# Patient Record
Sex: Female | Born: 1940 | Race: White | Hispanic: No | Marital: Married | Smoking: Never smoker
Health system: Southern US, Community
[De-identification: ages and names within clinical notes are randomized; demographics above are authoritative.]

## PROBLEM LIST (undated history)

## (undated) DIAGNOSIS — C801 Malignant (primary) neoplasm, unspecified: Secondary | ICD-10-CM

## (undated) DIAGNOSIS — M65341 Trigger finger, right ring finger: Secondary | ICD-10-CM

## (undated) DIAGNOSIS — F419 Anxiety disorder, unspecified: Secondary | ICD-10-CM

## (undated) DIAGNOSIS — M1990 Unspecified osteoarthritis, unspecified site: Secondary | ICD-10-CM

## (undated) DIAGNOSIS — N289 Disorder of kidney and ureter, unspecified: Secondary | ICD-10-CM

## (undated) DIAGNOSIS — R296 Repeated falls: Secondary | ICD-10-CM

## (undated) DIAGNOSIS — F0390 Unspecified dementia without behavioral disturbance: Secondary | ICD-10-CM

## (undated) DIAGNOSIS — G43909 Migraine, unspecified, not intractable, without status migrainosus: Secondary | ICD-10-CM

## (undated) DIAGNOSIS — I639 Cerebral infarction, unspecified: Secondary | ICD-10-CM

## (undated) DIAGNOSIS — Z9889 Other specified postprocedural states: Secondary | ICD-10-CM

## (undated) DIAGNOSIS — R55 Syncope and collapse: Secondary | ICD-10-CM

## (undated) DIAGNOSIS — F341 Dysthymic disorder: Secondary | ICD-10-CM

## (undated) DIAGNOSIS — R7302 Impaired glucose tolerance (oral): Secondary | ICD-10-CM

## (undated) DIAGNOSIS — M179 Osteoarthritis of knee, unspecified: Secondary | ICD-10-CM

## (undated) DIAGNOSIS — E785 Hyperlipidemia, unspecified: Secondary | ICD-10-CM

## (undated) DIAGNOSIS — I10 Essential (primary) hypertension: Secondary | ICD-10-CM

## (undated) DIAGNOSIS — R112 Nausea with vomiting, unspecified: Secondary | ICD-10-CM

## (undated) DIAGNOSIS — M1710 Unilateral primary osteoarthritis, unspecified knee: Secondary | ICD-10-CM

## (undated) HISTORY — PX: DILATION AND CURETTAGE OF UTERUS: SHX78

## (undated) HISTORY — DX: Osteoarthritis of knee, unspecified: M17.9

## (undated) HISTORY — DX: Anxiety disorder, unspecified: F41.9

## (undated) HISTORY — DX: Malignant (primary) neoplasm, unspecified: C80.1

## (undated) HISTORY — DX: Unilateral primary osteoarthritis, unspecified knee: M17.10

## (undated) HISTORY — PX: JOINT REPLACEMENT: SHX530

## (undated) HISTORY — DX: Dysthymic disorder: F34.1

## (undated) HISTORY — DX: Migraine, unspecified, not intractable, without status migrainosus: G43.909

## (undated) HISTORY — DX: Impaired glucose tolerance (oral): R73.02

## (undated) HISTORY — PX: REPLACEMENT TOTAL KNEE: SUR1224

## (undated) HISTORY — DX: Disorder of kidney and ureter, unspecified: N28.9

## (undated) HISTORY — DX: Hyperlipidemia, unspecified: E78.5

## (undated) HISTORY — DX: Essential (primary) hypertension: I10

## (undated) HISTORY — DX: Unspecified osteoarthritis, unspecified site: M19.90

## (undated) HISTORY — PX: ELBOW SURGERY: SHX618

---

## 1998-05-12 ENCOUNTER — Other Ambulatory Visit: Admission: RE | Admit: 1998-05-12 | Discharge: 1998-05-12 | Payer: Self-pay | Admitting: Gynecology

## 1999-02-22 HISTORY — PX: BREAST LUMPECTOMY: SHX2

## 1999-09-22 DIAGNOSIS — C801 Malignant (primary) neoplasm, unspecified: Secondary | ICD-10-CM

## 1999-09-22 HISTORY — DX: Malignant (primary) neoplasm, unspecified: C80.1

## 1999-10-07 ENCOUNTER — Other Ambulatory Visit: Admission: RE | Admit: 1999-10-07 | Discharge: 1999-10-07 | Payer: Self-pay | Admitting: Gynecology

## 1999-10-15 ENCOUNTER — Encounter: Admission: RE | Admit: 1999-10-15 | Discharge: 1999-10-15 | Payer: Self-pay | Admitting: General Surgery

## 1999-10-15 ENCOUNTER — Encounter (INDEPENDENT_AMBULATORY_CARE_PROVIDER_SITE_OTHER): Payer: Self-pay | Admitting: *Deleted

## 1999-10-15 ENCOUNTER — Other Ambulatory Visit: Admission: RE | Admit: 1999-10-15 | Discharge: 1999-10-15 | Payer: Self-pay | Admitting: General Surgery

## 1999-10-15 ENCOUNTER — Encounter: Payer: Self-pay | Admitting: General Surgery

## 1999-11-05 ENCOUNTER — Encounter: Payer: Self-pay | Admitting: General Surgery

## 1999-11-05 ENCOUNTER — Encounter: Admission: RE | Admit: 1999-11-05 | Discharge: 1999-11-05 | Payer: Self-pay | Admitting: General Surgery

## 1999-11-08 ENCOUNTER — Ambulatory Visit (HOSPITAL_BASED_OUTPATIENT_CLINIC_OR_DEPARTMENT_OTHER): Admission: RE | Admit: 1999-11-08 | Discharge: 1999-11-08 | Payer: Self-pay | Admitting: General Surgery

## 1999-11-08 ENCOUNTER — Encounter: Payer: Self-pay | Admitting: General Surgery

## 1999-11-08 ENCOUNTER — Encounter (INDEPENDENT_AMBULATORY_CARE_PROVIDER_SITE_OTHER): Payer: Self-pay | Admitting: Specialist

## 1999-11-24 ENCOUNTER — Encounter: Admission: RE | Admit: 1999-11-24 | Discharge: 2000-02-22 | Payer: Self-pay | Admitting: Radiation Oncology

## 2000-05-02 ENCOUNTER — Encounter: Payer: Self-pay | Admitting: General Surgery

## 2000-05-02 ENCOUNTER — Encounter: Admission: RE | Admit: 2000-05-02 | Discharge: 2000-05-02 | Payer: Self-pay | Admitting: General Surgery

## 2000-10-11 ENCOUNTER — Encounter: Admission: RE | Admit: 2000-10-11 | Discharge: 2001-01-09 | Payer: Self-pay | Admitting: Family Medicine

## 2000-10-19 ENCOUNTER — Encounter: Payer: Self-pay | Admitting: General Surgery

## 2000-10-19 ENCOUNTER — Encounter: Admission: RE | Admit: 2000-10-19 | Discharge: 2000-10-19 | Payer: Self-pay | Admitting: General Surgery

## 2000-10-20 ENCOUNTER — Encounter: Payer: Self-pay | Admitting: Orthopaedic Surgery

## 2000-10-24 ENCOUNTER — Inpatient Hospital Stay (HOSPITAL_COMMUNITY): Admission: RE | Admit: 2000-10-24 | Discharge: 2000-10-29 | Payer: Self-pay | Admitting: Orthopaedic Surgery

## 2001-10-23 ENCOUNTER — Encounter: Payer: Self-pay | Admitting: *Deleted

## 2001-10-23 ENCOUNTER — Encounter: Admission: RE | Admit: 2001-10-23 | Discharge: 2001-10-23 | Payer: Self-pay | Admitting: *Deleted

## 2002-01-11 ENCOUNTER — Encounter: Payer: Self-pay | Admitting: Orthopaedic Surgery

## 2002-01-15 ENCOUNTER — Inpatient Hospital Stay (HOSPITAL_COMMUNITY): Admission: RE | Admit: 2002-01-15 | Discharge: 2002-01-19 | Payer: Self-pay | Admitting: Orthopaedic Surgery

## 2002-02-12 ENCOUNTER — Ambulatory Visit (HOSPITAL_BASED_OUTPATIENT_CLINIC_OR_DEPARTMENT_OTHER): Admission: RE | Admit: 2002-02-12 | Discharge: 2002-02-12 | Payer: Self-pay | Admitting: Orthopaedic Surgery

## 2002-11-21 ENCOUNTER — Encounter: Payer: Self-pay | Admitting: Family Medicine

## 2002-11-21 ENCOUNTER — Encounter: Admission: RE | Admit: 2002-11-21 | Discharge: 2002-11-21 | Payer: Self-pay | Admitting: Family Medicine

## 2002-12-24 ENCOUNTER — Other Ambulatory Visit: Admission: RE | Admit: 2002-12-24 | Discharge: 2002-12-24 | Payer: Self-pay | Admitting: Gynecology

## 2003-12-01 ENCOUNTER — Encounter: Admission: RE | Admit: 2003-12-01 | Discharge: 2003-12-01 | Payer: Self-pay | Admitting: Family Medicine

## 2003-12-13 ENCOUNTER — Ambulatory Visit: Payer: Self-pay | Admitting: Oncology

## 2004-02-13 ENCOUNTER — Ambulatory Visit: Payer: Self-pay | Admitting: Oncology

## 2004-02-13 ENCOUNTER — Other Ambulatory Visit: Admission: RE | Admit: 2004-02-13 | Discharge: 2004-02-13 | Payer: Self-pay | Admitting: Gynecology

## 2004-06-24 ENCOUNTER — Ambulatory Visit: Payer: Self-pay | Admitting: Oncology

## 2004-12-01 ENCOUNTER — Encounter: Admission: RE | Admit: 2004-12-01 | Discharge: 2004-12-01 | Payer: Self-pay | Admitting: Oncology

## 2004-12-17 ENCOUNTER — Ambulatory Visit: Payer: Self-pay | Admitting: Oncology

## 2005-06-21 ENCOUNTER — Ambulatory Visit: Payer: Self-pay | Admitting: Oncology

## 2005-06-24 LAB — CBC WITH DIFFERENTIAL/PLATELET
BASO%: 0.5 % (ref 0.0–2.0)
Basophils Absolute: 0 10*3/uL (ref 0.0–0.1)
EOS%: 2.2 % (ref 0.0–7.0)
Eosinophils Absolute: 0.1 10*3/uL (ref 0.0–0.5)
HCT: 36.8 % (ref 34.8–46.6)
HGB: 12.4 g/dL (ref 11.6–15.9)
LYMPH%: 32.2 % (ref 14.0–48.0)
MCH: 30.8 pg (ref 26.0–34.0)
MCHC: 33.6 g/dL (ref 32.0–36.0)
MCV: 91.7 fL (ref 81.0–101.0)
MONO#: 0.3 10*3/uL (ref 0.1–0.9)
MONO%: 5.9 % (ref 0.0–13.0)
NEUT#: 2.9 10*3/uL (ref 1.5–6.5)
NEUT%: 59.2 % (ref 39.6–76.8)
Platelets: 200 10*3/uL (ref 145–400)
RBC: 4.01 10*6/uL (ref 3.70–5.32)
RDW: 13.6 % (ref 11.3–14.5)
WBC: 4.9 10*3/uL (ref 3.9–10.0)
lymph#: 1.6 10*3/uL (ref 0.9–3.3)

## 2005-06-24 LAB — COMPREHENSIVE METABOLIC PANEL
ALT: 18 U/L (ref 0–40)
AST: 26 U/L (ref 0–37)
Albumin: 4 g/dL (ref 3.5–5.2)
Alkaline Phosphatase: 88 U/L (ref 39–117)
BUN: 20 mg/dL (ref 6–23)
CO2: 27 mEq/L (ref 19–32)
Calcium: 9.1 mg/dL (ref 8.4–10.5)
Chloride: 104 mEq/L (ref 96–112)
Creatinine, Ser: 1.1 mg/dL (ref 0.4–1.2)
Glucose, Bld: 90 mg/dL (ref 70–99)
Potassium: 4.5 mEq/L (ref 3.5–5.3)
Sodium: 139 mEq/L (ref 135–145)
Total Bilirubin: 0.6 mg/dL (ref 0.3–1.2)
Total Protein: 6.8 g/dL (ref 6.0–8.3)

## 2005-06-24 LAB — LACTATE DEHYDROGENASE: LDH: 188 U/L (ref 94–250)

## 2005-07-25 ENCOUNTER — Ambulatory Visit: Payer: Self-pay | Admitting: Family Medicine

## 2005-09-28 ENCOUNTER — Ambulatory Visit: Payer: Self-pay | Admitting: Family Medicine

## 2006-03-02 ENCOUNTER — Ambulatory Visit: Payer: Self-pay | Admitting: Family Medicine

## 2006-04-07 ENCOUNTER — Ambulatory Visit: Payer: Self-pay | Admitting: Family Medicine

## 2006-04-11 ENCOUNTER — Ambulatory Visit: Payer: Self-pay | Admitting: Family Medicine

## 2006-05-25 ENCOUNTER — Ambulatory Visit: Payer: Self-pay | Admitting: Family Medicine

## 2006-06-20 ENCOUNTER — Ambulatory Visit: Payer: Self-pay | Admitting: Oncology

## 2006-06-26 ENCOUNTER — Ambulatory Visit: Payer: Self-pay | Admitting: Family Medicine

## 2006-06-27 LAB — CBC WITH DIFFERENTIAL/PLATELET
BASO%: 0.3 % (ref 0.0–2.0)
Basophils Absolute: 0 10*3/uL (ref 0.0–0.1)
EOS%: 3 % (ref 0.0–7.0)
HCT: 37.3 % (ref 34.8–46.6)
HGB: 12.9 g/dL (ref 11.6–15.9)
MCH: 31.4 pg (ref 26.0–34.0)
MCHC: 34.7 g/dL (ref 32.0–36.0)
MONO#: 0.6 10*3/uL (ref 0.1–0.9)
RDW: 13.1 % (ref 11.3–14.5)
WBC: 8.3 10*3/uL (ref 3.9–10.0)
lymph#: 1.9 10*3/uL (ref 0.9–3.3)

## 2006-06-27 LAB — COMPREHENSIVE METABOLIC PANEL
ALT: 14 U/L (ref 0–35)
AST: 19 U/L (ref 0–37)
Albumin: 4.1 g/dL (ref 3.5–5.2)
CO2: 26 mEq/L (ref 19–32)
Calcium: 9 mg/dL (ref 8.4–10.5)
Chloride: 107 mEq/L (ref 96–112)
Potassium: 3.9 mEq/L (ref 3.5–5.3)

## 2006-06-27 LAB — LACTATE DEHYDROGENASE: LDH: 173 U/L (ref 94–250)

## 2006-07-20 ENCOUNTER — Ambulatory Visit: Payer: Self-pay | Admitting: Family Medicine

## 2006-07-21 ENCOUNTER — Encounter: Admission: RE | Admit: 2006-07-21 | Discharge: 2006-07-21 | Payer: Self-pay | Admitting: Family Medicine

## 2006-08-22 ENCOUNTER — Ambulatory Visit: Payer: Self-pay | Admitting: Family Medicine

## 2006-09-22 ENCOUNTER — Ambulatory Visit: Payer: Self-pay | Admitting: Family Medicine

## 2006-10-16 ENCOUNTER — Ambulatory Visit: Payer: Self-pay | Admitting: Family Medicine

## 2006-10-24 ENCOUNTER — Ambulatory Visit: Payer: Self-pay | Admitting: Family Medicine

## 2006-12-14 ENCOUNTER — Ambulatory Visit: Payer: Self-pay | Admitting: Family Medicine

## 2007-01-09 ENCOUNTER — Ambulatory Visit: Payer: Self-pay | Admitting: Family Medicine

## 2007-02-08 ENCOUNTER — Ambulatory Visit: Payer: Self-pay | Admitting: Family Medicine

## 2007-02-12 ENCOUNTER — Ambulatory Visit: Payer: Self-pay | Admitting: Family Medicine

## 2007-06-19 ENCOUNTER — Ambulatory Visit: Payer: Self-pay | Admitting: Oncology

## 2007-06-22 LAB — COMPREHENSIVE METABOLIC PANEL
ALT: 16 U/L (ref 0–35)
AST: 27 U/L (ref 0–37)
Albumin: 4.1 g/dL (ref 3.5–5.2)
Alkaline Phosphatase: 76 U/L (ref 39–117)
BUN: 29 mg/dL — ABNORMAL HIGH (ref 6–23)
CO2: 26 mEq/L (ref 19–32)
Calcium: 8.9 mg/dL (ref 8.4–10.5)
Chloride: 105 mEq/L (ref 96–112)
Creatinine, Ser: 1.38 mg/dL — ABNORMAL HIGH (ref 0.40–1.20)
Glucose, Bld: 76 mg/dL (ref 70–99)
Potassium: 4.1 mEq/L (ref 3.5–5.3)
Sodium: 141 mEq/L (ref 135–145)
Total Bilirubin: 0.6 mg/dL (ref 0.3–1.2)
Total Protein: 6.7 g/dL (ref 6.0–8.3)

## 2007-06-22 LAB — CBC WITH DIFFERENTIAL/PLATELET
BASO%: 0.9 % (ref 0.0–2.0)
Basophils Absolute: 0 10*3/uL (ref 0.0–0.1)
EOS%: 1.7 % (ref 0.0–7.0)
Eosinophils Absolute: 0.1 10*3/uL (ref 0.0–0.5)
HCT: 35.9 % (ref 34.8–46.6)
HGB: 12.5 g/dL (ref 11.6–15.9)
LYMPH%: 37.9 % (ref 14.0–48.0)
MCH: 31.6 pg (ref 26.0–34.0)
MCHC: 34.9 g/dL (ref 32.0–36.0)
MCV: 90.4 fL (ref 81.0–101.0)
MONO#: 0.3 10*3/uL (ref 0.1–0.9)
MONO%: 6.9 % (ref 0.0–13.0)
NEUT#: 2.1 10*3/uL (ref 1.5–6.5)
NEUT%: 52.6 % (ref 39.6–76.8)
Platelets: 180 10*3/uL (ref 145–400)
RBC: 3.97 10*6/uL (ref 3.70–5.32)
RDW: 13.4 % (ref 11.3–14.5)
WBC: 4 10*3/uL (ref 3.9–10.0)
lymph#: 1.5 10*3/uL (ref 0.9–3.3)

## 2007-06-22 LAB — LACTATE DEHYDROGENASE: LDH: 190 U/L (ref 94–250)

## 2007-06-22 LAB — CANCER ANTIGEN 27.29: CA 27.29: 45 U/mL — ABNORMAL HIGH (ref 0–39)

## 2007-06-25 ENCOUNTER — Encounter: Admission: RE | Admit: 2007-06-25 | Discharge: 2007-06-25 | Payer: Self-pay | Admitting: Oncology

## 2007-11-27 ENCOUNTER — Ambulatory Visit: Payer: Self-pay | Admitting: Family Medicine

## 2008-03-10 ENCOUNTER — Ambulatory Visit: Payer: Self-pay | Admitting: Family Medicine

## 2008-03-13 ENCOUNTER — Encounter: Admission: RE | Admit: 2008-03-13 | Discharge: 2008-04-01 | Payer: Self-pay | Admitting: Family Medicine

## 2008-04-18 ENCOUNTER — Ambulatory Visit: Payer: Self-pay | Admitting: Family Medicine

## 2008-05-01 IMAGING — CT CT PARANASAL SINUSES LIMITED
1 series · 16 of 24 positions shown, 20 images · IV contrast (agent unspecified)
Comparison: None.

CLINICAL DATA: Unresolved sinusitis, congestion.  
 LIMITED CT OF THE PARANASAL SINUSES WITHOUT CONTRAST:
TECHNIQUE: Limited coronal CT images were obtained through the paranasal sinuses without intravenous contrast.

[Series 2: limited sinus prone · axial · 0.33mm/px · z∈[+31,+116]mm · 16 of 24 slices shown, 20 images]
[im 2/24  brain]
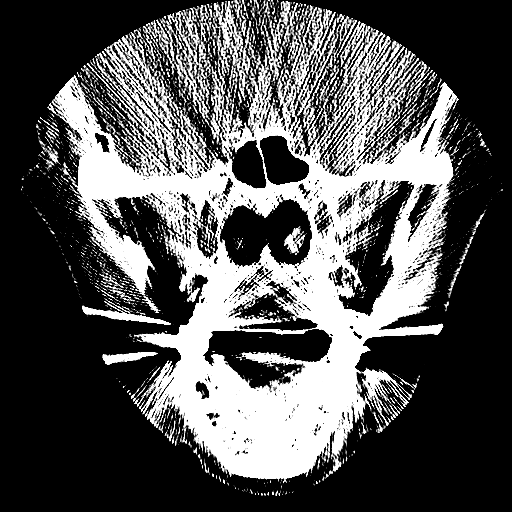
[im 2/24  bone]
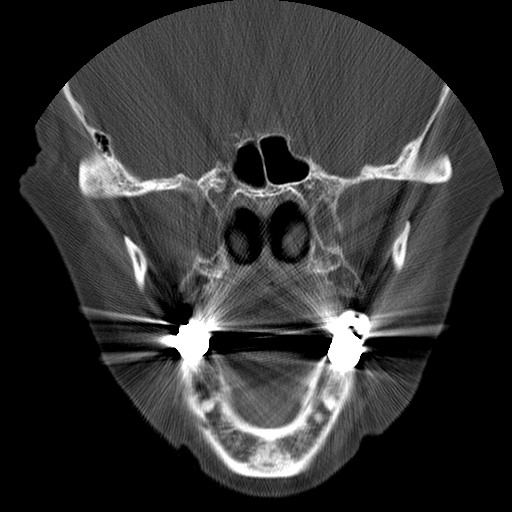
[im 4/24  bone]
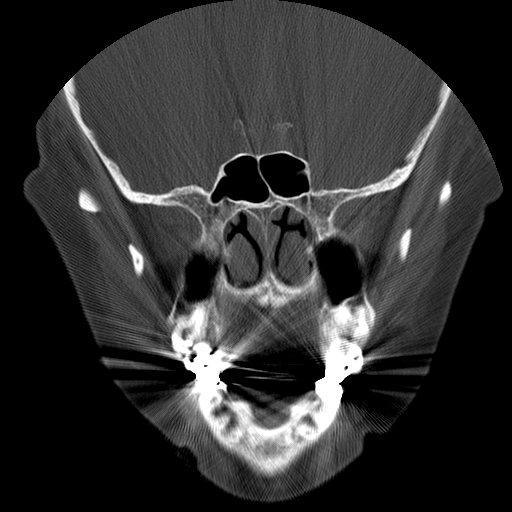
[im 5/24  bone]
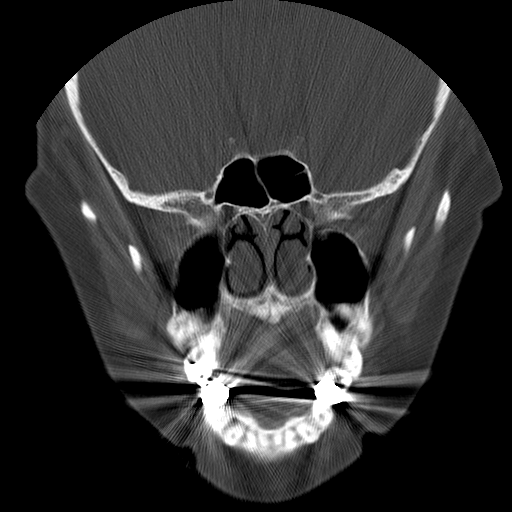
[im 6/24  bone]
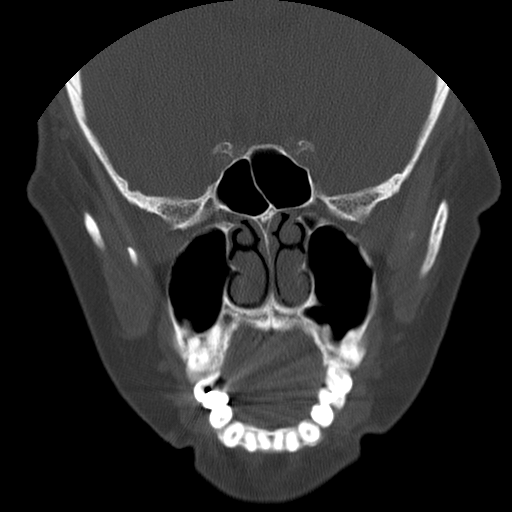
[im 8/24  brain]
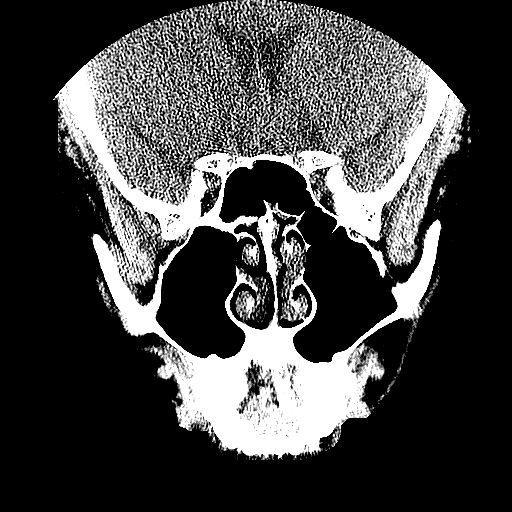
[im 8/24  bone]
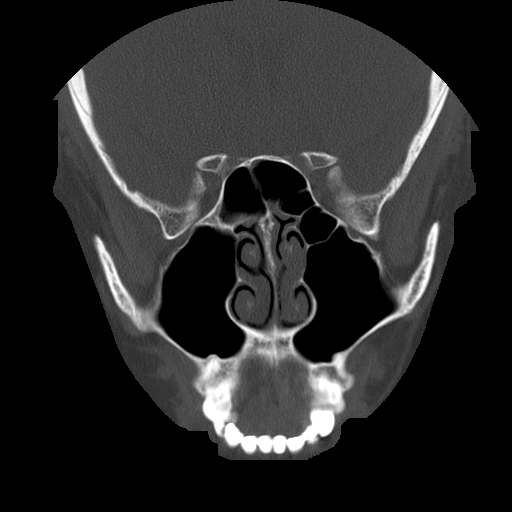
[im 9/24  bone]
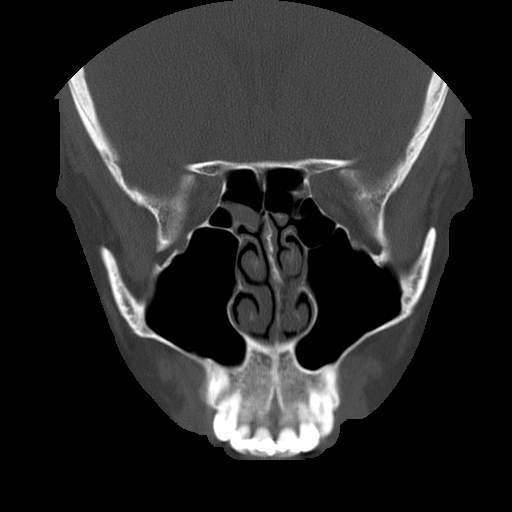
[im 10/24  bone]
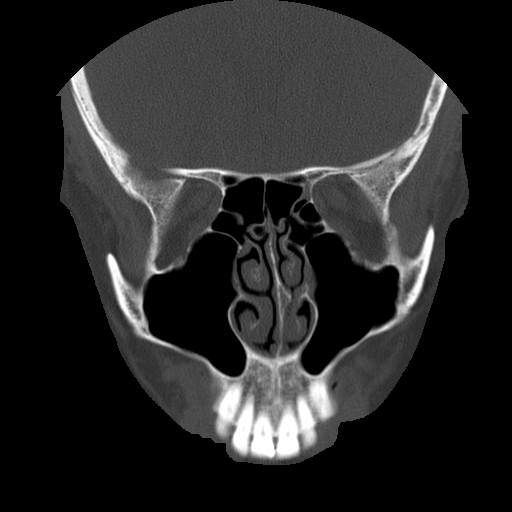
[im 12/24  bone]
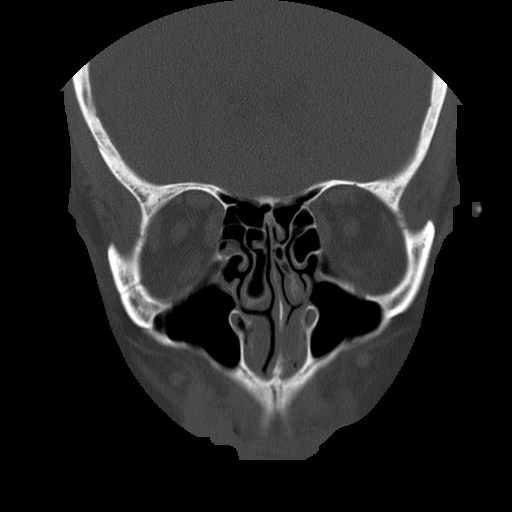
[im 13/24  brain]
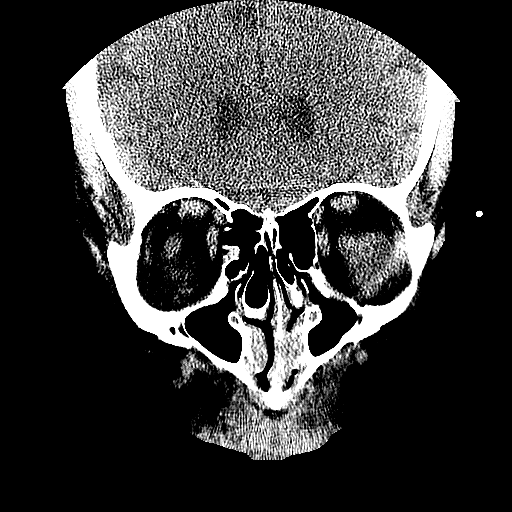
[im 13/24  bone]
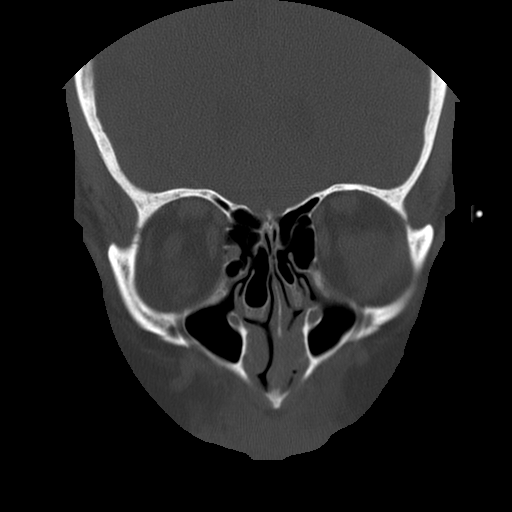
[im 15/24  bone]
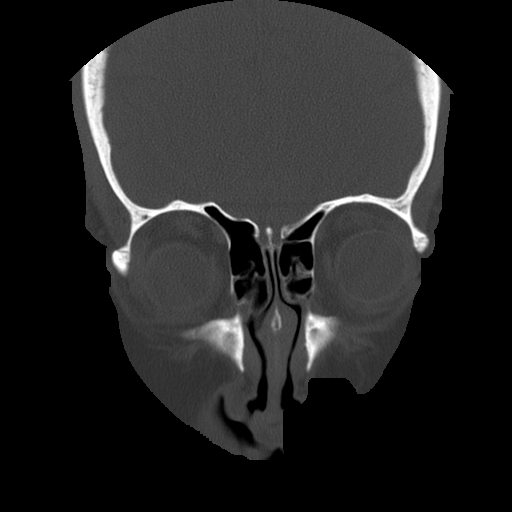
[im 16/24  bone]
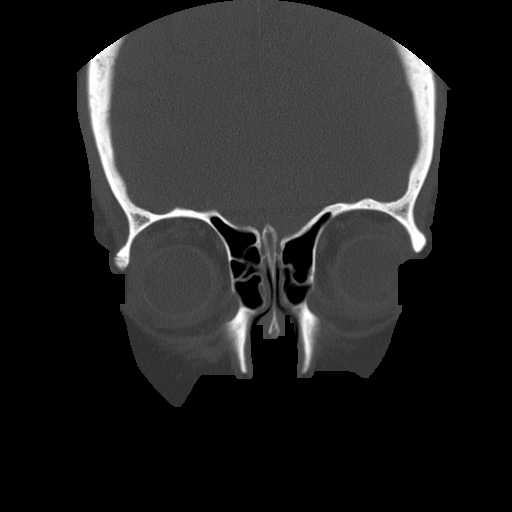
[im 17/24  bone]
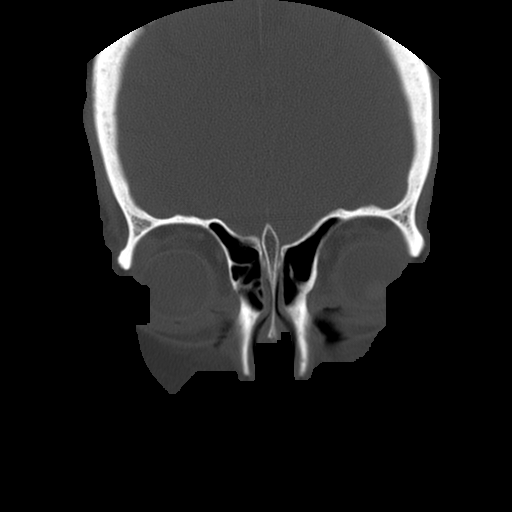
[im 19/24  brain]
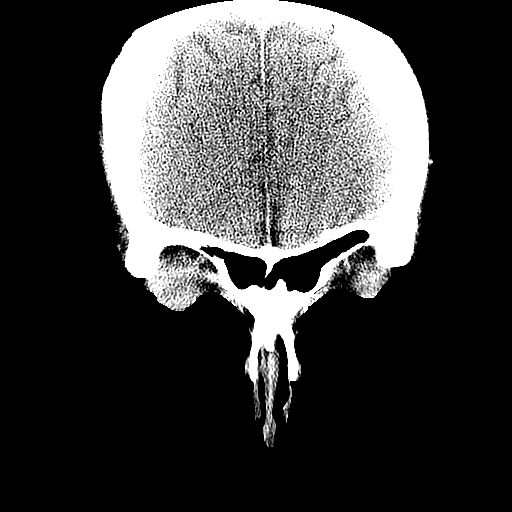
[im 19/24  bone]
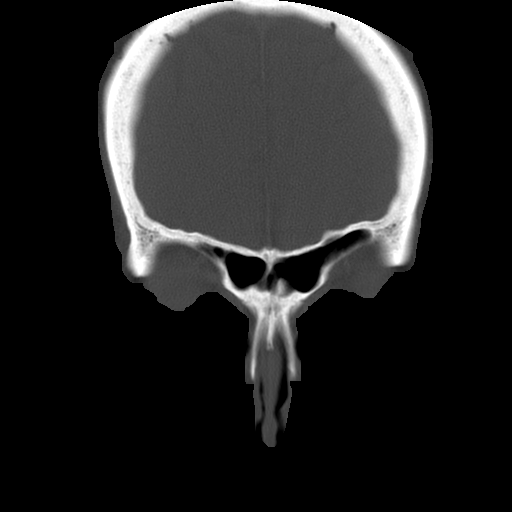
[im 20/24  bone]
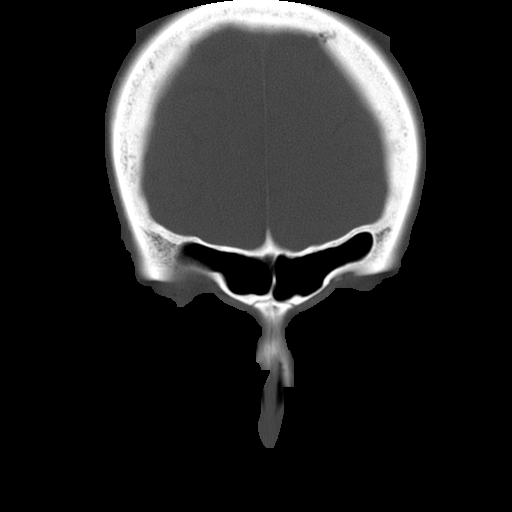
[im 21/24  bone]
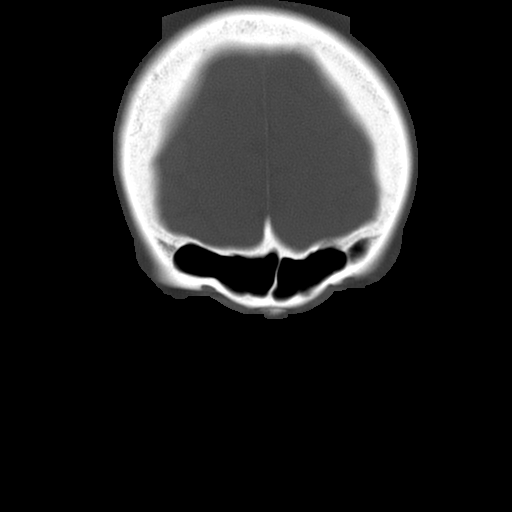
[im 23/24  bone]
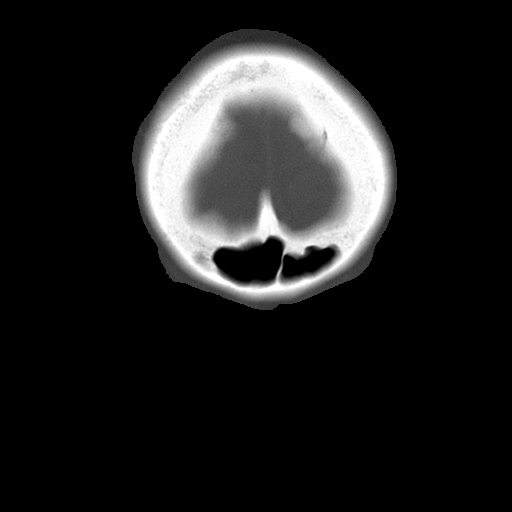

[16 of 24 positions shown; findings below may reference images not displayed]

FINDINGS: There is scattered opacification of the ethmoid air cells.  Paranasal sinuses are otherwise clear.  Mild rightward septal deviation.  Right frontal sinus osteoma.  Visualized intracranial contents show no acute findings.
IMPRESSION: Scattered ethmoid air cell opacification.

## 2008-06-25 ENCOUNTER — Ambulatory Visit: Payer: Self-pay | Admitting: Oncology

## 2008-07-23 ENCOUNTER — Encounter: Admission: RE | Admit: 2008-07-23 | Discharge: 2008-07-23 | Payer: Self-pay | Admitting: Oncology

## 2008-07-31 LAB — CBC WITH DIFFERENTIAL/PLATELET
BASO%: 0.4 % (ref 0.0–2.0)
Basophils Absolute: 0 10*3/uL (ref 0.0–0.1)
EOS%: 2.3 % (ref 0.0–7.0)
Eosinophils Absolute: 0.1 10*3/uL (ref 0.0–0.5)
HCT: 38.8 % (ref 34.8–46.6)
HGB: 13.5 g/dL (ref 11.6–15.9)
LYMPH%: 24.5 % (ref 14.0–49.7)
MCH: 32.2 pg (ref 25.1–34.0)
MCHC: 34.8 g/dL (ref 31.5–36.0)
MCV: 92.5 fL (ref 79.5–101.0)
MONO#: 0.5 10*3/uL (ref 0.1–0.9)
MONO%: 7.9 % (ref 0.0–14.0)
NEUT#: 3.8 10*3/uL (ref 1.5–6.5)
NEUT%: 64.9 % (ref 38.4–76.8)
Platelets: 198 10*3/uL (ref 145–400)
RBC: 4.2 10*6/uL (ref 3.70–5.45)
RDW: 13.2 % (ref 11.2–14.5)
WBC: 5.8 10*3/uL (ref 3.9–10.3)
lymph#: 1.4 10*3/uL (ref 0.9–3.3)

## 2008-07-31 LAB — COMPREHENSIVE METABOLIC PANEL
ALT: 20 U/L (ref 0–35)
AST: 27 U/L (ref 0–37)
Albumin: 3.6 g/dL (ref 3.5–5.2)
Alkaline Phosphatase: 95 U/L (ref 39–117)
BUN: 25 mg/dL — ABNORMAL HIGH (ref 6–23)
CO2: 31 mEq/L (ref 19–32)
Calcium: 9.3 mg/dL (ref 8.4–10.5)
Chloride: 102 mEq/L (ref 96–112)
Creatinine, Ser: 1.28 mg/dL — ABNORMAL HIGH (ref 0.40–1.20)
Glucose, Bld: 101 mg/dL — ABNORMAL HIGH (ref 70–99)
Potassium: 3.8 mEq/L (ref 3.5–5.3)
Sodium: 140 mEq/L (ref 135–145)
Total Bilirubin: 0.8 mg/dL (ref 0.3–1.2)
Total Protein: 6.9 g/dL (ref 6.0–8.3)

## 2008-07-31 LAB — LACTATE DEHYDROGENASE: LDH: 162 U/L (ref 94–250)

## 2008-08-01 LAB — CANCER ANTIGEN 27.29: CA 27.29: 44 U/mL — ABNORMAL HIGH (ref 0–39)

## 2008-08-01 LAB — VITAMIN D 25 HYDROXY (VIT D DEFICIENCY, FRACTURES): Vit D, 25-Hydroxy: 57 ng/mL (ref 30–89)

## 2008-11-24 ENCOUNTER — Ambulatory Visit: Payer: Self-pay | Admitting: Family Medicine

## 2009-03-11 ENCOUNTER — Ambulatory Visit: Payer: Self-pay | Admitting: Family Medicine

## 2009-04-17 ENCOUNTER — Ambulatory Visit: Payer: Self-pay | Admitting: Family Medicine

## 2009-04-24 ENCOUNTER — Ambulatory Visit: Payer: Self-pay | Admitting: Family Medicine

## 2009-05-15 ENCOUNTER — Ambulatory Visit: Payer: Self-pay | Admitting: Family Medicine

## 2009-07-28 ENCOUNTER — Ambulatory Visit: Payer: Self-pay | Admitting: Oncology

## 2009-07-30 LAB — COMPREHENSIVE METABOLIC PANEL
ALT: 15 U/L (ref 0–35)
AST: 23 U/L (ref 0–37)
Albumin: 4.1 g/dL (ref 3.5–5.2)
Alkaline Phosphatase: 94 U/L (ref 39–117)
BUN: 28 mg/dL — ABNORMAL HIGH (ref 6–23)
CO2: 26 mEq/L (ref 19–32)
Calcium: 8.9 mg/dL (ref 8.4–10.5)
Chloride: 102 mEq/L (ref 96–112)
Creatinine, Ser: 1.44 mg/dL — ABNORMAL HIGH (ref 0.40–1.20)
Glucose, Bld: 83 mg/dL (ref 70–99)
Potassium: 4 mEq/L (ref 3.5–5.3)
Sodium: 139 mEq/L (ref 135–145)
Total Bilirubin: 0.5 mg/dL (ref 0.3–1.2)
Total Protein: 6.8 g/dL (ref 6.0–8.3)

## 2009-07-30 LAB — CBC WITH DIFFERENTIAL/PLATELET
BASO%: 0.7 % (ref 0.0–2.0)
Basophils Absolute: 0 10*3/uL (ref 0.0–0.1)
EOS%: 1.7 % (ref 0.0–7.0)
Eosinophils Absolute: 0.1 10*3/uL (ref 0.0–0.5)
HCT: 37.9 % (ref 34.8–46.6)
HGB: 13.1 g/dL (ref 11.6–15.9)
LYMPH%: 35.5 % (ref 14.0–49.7)
MCH: 31.7 pg (ref 25.1–34.0)
MCHC: 34.5 g/dL (ref 31.5–36.0)
MCV: 91.8 fL (ref 79.5–101.0)
MONO#: 0.3 10*3/uL (ref 0.1–0.9)
MONO%: 6.8 % (ref 0.0–14.0)
NEUT#: 2.8 10*3/uL (ref 1.5–6.5)
NEUT%: 55.3 % (ref 38.4–76.8)
Platelets: 188 10*3/uL (ref 145–400)
RBC: 4.13 10*6/uL (ref 3.70–5.45)
RDW: 13 % (ref 11.2–14.5)
WBC: 5.1 10*3/uL (ref 3.9–10.3)
lymph#: 1.8 10*3/uL (ref 0.9–3.3)

## 2009-07-30 LAB — CANCER ANTIGEN 27.29: CA 27.29: 45 U/mL — ABNORMAL HIGH (ref 0–39)

## 2009-07-30 LAB — LACTATE DEHYDROGENASE: LDH: 165 U/L (ref 94–250)

## 2009-07-30 LAB — VITAMIN D 25 HYDROXY (VIT D DEFICIENCY, FRACTURES): Vit D, 25-Hydroxy: 30 ng/mL (ref 30–89)

## 2009-08-03 ENCOUNTER — Encounter: Admission: RE | Admit: 2009-08-03 | Discharge: 2009-08-03 | Payer: Self-pay | Admitting: Oncology

## 2010-01-22 ENCOUNTER — Ambulatory Visit: Payer: Self-pay | Admitting: Family Medicine

## 2010-03-14 ENCOUNTER — Encounter: Payer: Self-pay | Admitting: Oncology

## 2010-07-09 NOTE — Op Note (Signed)
[employer]. [facility]  Patient:    Stacey Wells, Stacey Wells Visit Number: [id number] MRN: [id number]          Service Type: SUR Location: 5000 [id number] [id number] Attending Physician:  Marcene Corning Dictated by:   Lubertha Basque. Jerl Santos, M.D. Proc. Date: 10/24/00 Admit Date:  10/24/2000                             Operative Report  PREOPERATIVE DIAGNOSIS:  Right knee degenerative arthritis.  POSTOPERATIVE DIAGNOSIS:  Right knee degenerative arthritis.  OPERATION PERFORMED:  Right total knee replacement.  ANESTHESIA:  General and postoperative epidural.  ATTENDING SURGEON:  Lubertha Basque. Jerl Santos, M.D.  ASSISTANT: 1. Harvie Junior, M.D. 2. Prince Rome, P.A.  INDICATIONS FOR PROCEDURE:  The patient is a 70 year old woman with a very long history of bilateral knee pain.  This persisted despite various oral anti-inflammatories as well as the use of many cortisone injections.  She has bone-on-bone degenerative change in the medial and at this point was offered a knee replacement operation on the right.  The procedure was discussed with the patient and informed operative consent was obtained after discussion of possible complications of reaction to anesthesia, infection, deep vein thrombosis, pulmonary embolus, and death.  DESCRIPTION OF PROCEDURE:  The patient was taken to an operating suite where general anesthetic was applied without difficulty.  She was then positioned supine and prepped and draped in normal sterile fashion.  After the administration of preop intravenous antibiotics, the right leg was elevated, exsanguinated, and tourniquet inflated about the thigh.  An anterior approach was taken to the knee through a longitudinal incision.  All appropriate anti-infective measures were used including closed hooded exhaust systems for each member of the scrub team, Betadine impregnated drape, and the aforementioned IV antibiotic.  Dissection was carried down to the  extensor mechanism.  A medial parapatellar incision was made through this structure. The knee cap was flipped and the knee flexed past 90 degrees.  She had severe degenerative change in the medial compartment and moderate change in the other two compartments.  She had excellent bone quality.  Some residual meniscal tissues were removed along with her ACL and PCL.  An extramedullary guide was placed on the tibia to create a cut on the tibia parallel with the floor.  An intramedullary guide was then placed on the femur to make a series of cuts on the femur anterior and posterior to create a 10 mm flexion gap.  Another intramedullary guide was placed on the femur to create an extension cut making a 10 mm extension gap balancing the knee.  A chamfer guide was placed on the femur which was standard plus.  The tibia sized to a #4 component and the appropriate guide was placed and used including a keel.  The patella was taken down from 24 to 12 mm and the appropriate drill holes were made with the standard plus guide.  A trial reduction was performed with all the aforementioned Depuy components.  She came to a slight hyperextension and flexed easily.  The knee cap tracked well and no lateral release was required. All trial components were removed and pulsatile lavage was used to irrigate all bony surfaces.  These were completely dried and cement was mixed including antibiotics.  The cement was then pressurized on all three bony surfaces followed by placement of the Depuy LCS components which were standard plus femur, standard plus  patella, #4 tibia, and a 10 mm deep dish spacer. Pressure was held on the component until the cement had hardened and all excess was trimmed.  The tourniquet was deflated.  The knee came easily to full extension and the knee cap tracked well.  The knee flexed to about 120 degrees without much difficulty.  A drain was placed exiting superolaterally after a thorough  irrigation was performed.  The extensor mechanism was reapproximated with #1 Vicryl in interrupted fashion.  Once this was accomplished, the knee again flexed to 120 degrees without difficulty. Subcutaneous tissues were reapproximated with 0 and 2-0 undyed Vicryl followed by skin closure with staples.  Adaptic was placed on the wound followed by dry gauze and loose Ace wrap.  Estimated blood loss and intraoperative fluids can be obtained from Anesthesia records as can accurate tourniquet time which was under one hour.  DISPOSITION:  The patient was extubated in the operating room.  Plans were for her to receive an epidural in the operating room for postoperative pain control.  She was eventually transferred to recovery room in stable condition. She is to be admitted to orthopedic surgery service for appropriate postoperative care to include perioperative antibiotic and Coumadin for DVT prophylaxis. Dictated by:   Lubertha Basque Jerl Santos, M.D. Attending Physician:  Marcene Corning DD:  10/24/00 TD:  10/24/00 Job: [id number]

## 2010-07-09 NOTE — Discharge Summary (Signed)
   NAME:  Stacey Wells, Stacey Wells NO.:  [id number]   MEDICAL RECORD NO.:  [id number]                   PATIENT TYPE:  INP   LOCATION:  5035                                 FACILITY:  [facility]   PHYSICIAN:  Lubertha Basque. Jerl Santos, M.D.             DATE OF BIRTH:  03-04-1940   DATE OF ADMISSION:  01/15/2002  DATE OF DISCHARGE:  01/19/2002                                 DISCHARGE SUMMARY   ADMISSION DIAGNOSES:  1. End-stage degenerative joint disease left knee.  2. Status post right total knee replacement.  3. History of hypertension and hypercholesterolemia.   DISCHARGE DIAGNOSES:  1. End-stage degenerative joint disease left knee.  2. Status post right total knee replacement.  3. History of hypertension and hypercholesterolemia.   OPERATION:  Left total knee replacement.   PERTINENT LABORATORY AND X-RAY DATA:  Normal sinus rhythm on EKG.   Chest x-ray mild right middle lobe atelectasis versus scarring, otherwise  clear lungs.   She was on low dose Coumadin protocol so she had serial INRs drawn.  Hemoglobin on her last testing was 10.3, hematocrit 30.9.   HOSPITAL COURSE:  The patient was admitted postoperatively, placed on a  variety of p.o. and IM analgesics for pain, low dose Coumadin protocol and  while she was on Coumadin, getting regulated with Lovenox until INR was  greater than 2.0.  Physical therapy was ordered.  She was on CPM while in  bed and weightbearing as tolerated.  Dressing and drain pulled on the second  day postoperatively.  Dressing changed numerous times throughout the  hospital stay.  Arrangements for home physical therapy and blood draws for  protimes were done.  She progressed well and was discharged home.   CONDITION ON DISCHARGE:  Improved.   DISCHARGE INSTRUCTIONS:  She will be on Coumadin for four weeks from  operative date.  She will be weightbearing as tolerated.  Arrangements for  home physical therapy and pain medication  prescription given to her.   FOLLOW UP:  She will follow up in the office in one week from discharge.      Lindwood Qua, P.A.                    Lubertha Basque Jerl Santos, M.D.    MC/MEDQ  D:  04/02/2002  T:  04/02/2002  Job:  [id number]

## 2010-07-09 NOTE — Op Note (Signed)
[place]. [facility]  Patient:    [name], Stacey Wells                      MRN: [id number] Proc. Date: 11/08/99 Adm. Date:  [id number] Attending:  Janalyn Rouse CC:         Ronnald Nian, M.D.  Luvenia Redden, M.D.   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  OPERATION: 1. Right partial mastectomy with needle locations and specimen mammography. 2. Sentinel lymph node biopsy.  SURGEON:  Rose Phi. Maple Hudson, M.D.  ANESTHESIA:  General.  INDICATIONS:  This 70 year old married female had had a routine mammogram which showed a new small spiculated lesion at about the 10 position of the right breast.  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  DESCRIPTION OF PROCEDURE:  Prior to coming to the operating room the patient had one mc of technetium sulfur colloid injected intradermally around the areola.  We scanned the axilla and found a red hot spot which I marked.  We then prepped and draped the breast and axilla.  A radial incision was outlined around the wire placed in laterally going to the lesion, and I did a wide excision using the harmonic scalpel to excise it.  Specimen mammography confirmed the removal of the lesion.  We had good hemostasis.  We then made a short transverse right axillary incision and dissected through the subcutaneous tissues to the clavipectoral fascia.  Using the neoprobe as a guide, we found the hot node with 400 or so count, and I excised it.  There were no other palpable nodes, and there were no other hot spots.  That was submitted by the pathologist.  We closed the incision with subcutaneous #3-0 Vicryl and subcuticular #4-0 Monocryl with Steri-Strips.  A touch prep evaluation of the sentinel node was negative, as was evaluation of the lesion in the breast.  Dressings  were applied.  The patient was transferred to the recovery room in satisfactory condition, having tolerated the procedure well. DD:  11/08/99 TD:  11/09/99 Job: [id number]

## 2010-07-09 NOTE — Op Note (Signed)
NAME:  Stacey Wells, Stacey Wells                         ACCOUNT NO.:  [id number]   MEDICAL RECORD NO.:  [id number]                   PATIENT TYPE:  INP   LOCATION:  NA                                   FACILITY:  [facility]   PHYSICIAN:  Lubertha Basque. Jerl Santos, M.D.             DATE OF BIRTH:  1940-08-03   DATE OF PROCEDURE:  01/15/2002  DATE OF DISCHARGE:                                 OPERATIVE REPORT   PREOPERATIVE DIAGNOSES:  Left knee degenerative arthritis.   POSTOPERATIVE DIAGNOSES:  Left knee degenerative arthritis.   OPERATION PERFORMED:  Left total knee replacement.   SURGEON:  Lubertha Basque. Jerl Santos, M.D.   ASSISTANT:  Prince Rome, P.A.   ANESTHESIA:  General and femoral nerve block.   INDICATIONS FOR PROCEDURE:  The patient is a 70 year old woman with a long  history of bilateral degenerative arthritis of her knees.  She has failed  various injectables, oral anti-inflammatories and activity restriction.  She  was left with pain with activity and at rest, then was offered knee  replacement surgery with end stage degeneration seen on plain films in both  knees.  She is one year out from a successful right knee replacement and at  this point is offered the same procedure on the left.  The procedure was  discussed with the patient and informed operative consent was obtained after  discussion of possible complications of reaction to anesthesia, infection,  DVT, PE, and death.   DESCRIPTION OF PROCEDURE:  The patient was taken to the operating suite  where general anesthetic was applied without difficulty.  The patient was  positioned supine and prepped and draped in the normal sterile fashion.  After administration of  preop intravenous antibiotics, the left leg was  elevated, exsanguinated, and a tourniquet inflated about the thigh.  An  anterior longitudinal incision was made with dissection down to the extensor  mechanism.  All appropriate anti-infective measures were used  including  preoperative IV antibiotic, Betadine impregnated drape, and closed hooded  exhaust systems for each member of the surgical team.  A medial parapatellar  incision was made through the extensor mechanism.  The knee cap was flipped  and the knee was flexed past 100 degrees.  She had severe degenerative  change in the medial compartment with complete loss of articular cartilage  and erosion into the femoral condyle.  Some residual meniscal tissues were  removed followed by removal of an intact ACL and PCL.  Next, a medullary  guide was placed on the tibia and a cut made parallel to the floor.  An  intramedullary guide was then placed in the femur to create anterior and  posterior cuts making a flexion gap of 10 mm.  A second intramedullary guide  was placed in the femur to make a distal cut creating an extension gap equal  to the flexion gap of 10 mm.  The femur sized  to a standard plus component  and appropriate guide was placed and used.  The tibia sized to a size 4  component and appropriate guide was placed and used.  The patella was cut  down 9 or 10 mm in thickness with an oscillating saw.  The appropriate guide  was placed here and utilized as well.  The trial reduction was done with the  aforementioned component and then a 10 mm spacer.  She came to full  extension easily and flexed past 100 degrees with the knee cap tracking  well.  No lateral release was required.  The trial components were removed  and pulsatile lavage was used to irrigate all cut bony surfaces.  Cement was  mixed including antibiotic and was pressurized in all three bones.  The  aforementioned Depuy components were placed which were a standard plus  femur, standard plus patella, 4 tibia and 10 mm deep disk spacer.  Pressure  was held on components until the cement had hardened and excess cement was  trimmed.  The tourniquet was deflated and the knee was thoroughly irrigated.  A small amount of bleeding  was easily controlled with Bovie cautery.  The  drain was placed exiting superolaterally.  The extensor mechanism was  reapproximated with #1 Vicryl in interrupted fashion.  Once this was  accomplished, the knee flexed past 100 degrees passively without much  trouble.  Subcutaneous tissues were reapproximated with 0 and 2-0 undyed  Vicryl followed by skin closure with staples.  Adaptic was applied to the  wound followed by dry gauze and a loose Ace wrap.  Estimated blood loss and  intraoperative fluids as well as accurate tourniquet time can be obtained  from anesthesia records.   DISPOSITION:  The patient was extubated in the operating room where a  femoral nerve block was applied.  She was taken to the recovery room in  stable condition.  Plans were for the patient to be admitted to the  orthopedic surgery service for appropriate postoperative care to include  perioperative antibiotics and Coumadin for DVT prophylaxis.                                                 Lubertha Basque Jerl Santos, M.D.    PGD/MEDQ  D:  01/15/2002  T:  01/15/2002  Job:  [id number]

## 2010-07-09 NOTE — Procedures (Signed)
[place]. [facility]  Patient:    Stacey Wells, Stacey Wells Visit Number: [id number] MRN: [id number]          Service Type: SUR Location: RCRM 2550 02 Attending Physician:  Marcene Corning Dictated by:   Janetta Hora. Gelene Mink, M.D. Proc. Date: 10/24/00 Admit Date:  10/24/2000                             Procedure Report  I was consulted by Dr. Marcene Corning to provide postoperative pain relief for his patient, Mrs. Umland.  We decided this would take the form of epidural catheter placement and postoperative monitoring by the anesthesiologist.  The risks and benefits of the procedure were discussed with the patient in the preoperative period.  The patient was aware of the risks and benefits and agreeable to the procedure.  DESCRIPTION OF PROCEDURE:  At the completion of the operative procedure, the patient was turned into the right lateral decubitus position.  The back was prepped liberally with Betadine solution.  The area was draped.  A #17 gauge Tuohy needle was used in a paramedian approach at the L2-3 interspace.  A loss of resistance technique with air was used.  The epidural system was located easily in the first attempt.  A Burron catheter was passed 5 cm into the epidural space.  The needle was removed without apparent movement of the catheter.  The catheter was aspirated.  There was no sign of cerebrospinal fluid or blood.  The catheter was injected with a solution containing 5 cc of 1% Lidocaine plain and 5 cc of sterile preservative free normal saline. Catheter injected easily.  Repeat aspiration revealed no evidence of cerebrospinal fluid or blood.  The catheter was taped securely in place.  The patient was placed in the supine position and emerged uneventfully from the anesthetic.  The patient was brought to the recovery room in good condition. Dictated by:   Janetta Hora Gelene Mink, M.D. Attending Physician:  Marcene Corning DD:  10/24/00 TD:   10/24/00 Job: [id number]

## 2010-07-09 NOTE — Discharge Summary (Signed)
[place]. [facility]  Patient:    Stacey Wells, Stacey Wells Visit Number: [id number] MRN: [id number]          Service Type: SUR Location: 5000 5041 01 Attending Physician:  Marcene Corning Dictated by:   Prince Rome, P.A. Admit Date:  10/24/2000 Discharge Date: 10/29/2000                             Discharge Summary  ADMISSION DIAGNOSES: 1. Right knee end-stage degenerative joint disease. 2. Hypercholesterolemia. 3. Hypertension.  DISCHARGE DIAGNOSES: 1. Right knee end-stage degenerative joint disease. 2. Hypercholesterolemia. 3. Hypertension.  OPERATIONS:  Right total knee replacement.  BRIEF HISTORY:  A 70 year old white female who we have known for quite some time in our office.  X-rays reveal end-stage DJD of her knees.  She has undergone multiple injections over the past months to years with lessening degrees of relief and it is discussed with her proceeding with a total knee replacement.  She is now having pain with every step and pain at nighttime.  PERTINENT LABORATORY AND X-RAY FINDINGS:  Essentially normal.  No active lung disease.  Last tested hemoglobin is 10.6, hematocrit 29.8.  INR is followed serially, as she was on low-dose Coumadin protocol.  HOSPITAL COURSE:  The patient was admitted.  Postoperatively, placed on a variety of p.o. and IM analgesics for pain were ordered.  Dressing and drains changed and pulled the second day postop.  The rehabilitation unit was also consulted for possible stay there.  She progressed with physical therapy.  She could be weightbearing as tolerated.  Pharmacy helped regulate her Coumadin and low-dose Coumadin protocol to prevent DVTs.  She was discharged.  CONDITION ON DISCHARGE:  Improved.  FOLLOW-UP:  She will remain on Coumadin for four weeks.  She has been given pain medication.  She will be weightbearing as tolerated with crutches or a walker and home arrangements for physical therapy and  aide have been made. Dictated by:   Prince Rome, P.A. Attending Physician:  Marcene Corning DD:  12/05/00 TD:  12/05/00 Job: [id number]

## 2010-07-10 ENCOUNTER — Other Ambulatory Visit: Payer: Self-pay | Admitting: Family Medicine

## 2010-07-13 NOTE — Telephone Encounter (Signed)
She needs an appointment but the o and out of the medications

## 2010-08-09 ENCOUNTER — Other Ambulatory Visit: Payer: Self-pay

## 2010-08-09 MED ORDER — VALSARTAN-HYDROCHLOROTHIAZIDE 320-25 MG PO TABS: 1.0000 | ORAL_TABLET | Freq: Every day | ORAL | Status: DC

## 2010-10-22 ENCOUNTER — Other Ambulatory Visit: Payer: Self-pay | Admitting: Family Medicine

## 2010-10-26 ENCOUNTER — Telehealth: Payer: Self-pay

## 2010-10-26 NOTE — Telephone Encounter (Signed)
LEFT MESSAGE FOR PT TO MAKE APPT

## 2010-10-26 NOTE — Telephone Encounter (Signed)
Check appointment. Don't let out of medication but schedule

## 2010-10-26 NOTE — Telephone Encounter (Signed)
Is this ok?

## 2010-11-08 ENCOUNTER — Telehealth: Payer: Self-pay | Admitting: Family Medicine

## 2010-11-08 MED ORDER — VALSARTAN-HYDROCHLOROTHIAZIDE 320-25 MG PO TABS: 1.0000 | ORAL_TABLET | Freq: Every day | ORAL | Status: DC

## 2010-11-08 NOTE — Telephone Encounter (Signed)
Sent med

## 2010-12-23 ENCOUNTER — Encounter: Payer: Self-pay | Admitting: Family Medicine

## 2011-01-31 ENCOUNTER — Telehealth: Payer: Self-pay | Admitting: Family Medicine

## 2011-01-31 MED ORDER — SIMVASTATIN 40 MG PO TABS: 40.0000 mg | ORAL_TABLET | ORAL | Status: DC

## 2011-01-31 MED ORDER — FLUOXETINE HCL 20 MG PO CAPS: 20.0000 mg | ORAL_CAPSULE | Freq: Every day | ORAL | Status: DC

## 2011-01-31 MED ORDER — VALSARTAN-HYDROCHLOROTHIAZIDE 320-25 MG PO TABS: 1.0000 | ORAL_TABLET | Freq: Every day | ORAL | Status: DC

## 2011-01-31 NOTE — Telephone Encounter (Signed)
Medications were reordered.

## 2011-01-31 NOTE — Telephone Encounter (Signed)
Her meds were reordered

## 2011-02-09 ENCOUNTER — Encounter: Payer: Self-pay | Admitting: Family Medicine

## 2011-02-09 ENCOUNTER — Ambulatory Visit (INDEPENDENT_AMBULATORY_CARE_PROVIDER_SITE_OTHER): Payer: Medicare Other | Admitting: Family Medicine

## 2011-02-09 VITALS — BP 140/100 | HR 100 | Wt 196.0 lb

## 2011-02-09 DIAGNOSIS — F341 Dysthymic disorder: Secondary | ICD-10-CM

## 2011-02-09 DIAGNOSIS — E785 Hyperlipidemia, unspecified: Secondary | ICD-10-CM

## 2011-02-09 DIAGNOSIS — I10 Essential (primary) hypertension: Secondary | ICD-10-CM

## 2011-02-09 DIAGNOSIS — R208 Other disturbances of skin sensation: Secondary | ICD-10-CM

## 2011-02-09 DIAGNOSIS — R209 Unspecified disturbances of skin sensation: Secondary | ICD-10-CM

## 2011-02-09 LAB — COMPREHENSIVE METABOLIC PANEL
ALT: 16 U/L (ref 0–35)
Albumin: 4.2 g/dL (ref 3.5–5.2)
Alkaline Phosphatase: 87 U/L (ref 39–117)
Glucose, Bld: 92 mg/dL (ref 70–99)
Potassium: 4.2 mEq/L (ref 3.5–5.3)
Sodium: 139 mEq/L (ref 135–145)
Total Protein: 6.9 g/dL (ref 6.0–8.3)

## 2011-02-09 LAB — LIPID PANEL
LDL Cholesterol: 125 mg/dL — ABNORMAL HIGH (ref 0–99)
Triglycerides: 110 mg/dL (ref <150)

## 2011-02-09 LAB — CBC WITH DIFFERENTIAL/PLATELET
Basophils Relative: 1 % (ref 0–1)
Hemoglobin: 13.7 g/dL (ref 12.0–15.0)
Lymphs Abs: 1.8 10*3/uL (ref 0.7–4.0)
MCHC: 33.7 g/dL (ref 30.0–36.0)
Monocytes Relative: 6 % (ref 3–12)
Neutro Abs: 2.9 10*3/uL (ref 1.7–7.7)
Neutrophils Relative %: 57 % (ref 43–77)
RBC: 4.38 MIL/uL (ref 3.87–5.11)

## 2011-02-09 MED ORDER — AMITRIPTYLINE HCL 75 MG PO TABS: 75.0000 mg | ORAL_TABLET | Freq: Every day | ORAL | Status: DC

## 2011-02-09 NOTE — Patient Instructions (Signed)
Try the higher dose of the amitriptyline to see if this helps with your tingling and with sleep. Call me in several weeks. Talked to her husband about getting counseling for better communication.

## 2011-02-09 NOTE — Progress Notes (Signed)
  Subjective:    Patient ID: Stacey Wells, female    DOB: March 24, 1940, 70 y.o.   MRN: [id number]  HPI She is here for medication check. She continues on the Prozac and thinks he might need a higher dose. Her main concern is dealing with her husband who apparently can be difficult at times to deal with. She also said difficulty with tingling sensation and pain mainly in her lower extremities at night; the symptoms do not tend to occur during the day.She's had no weakness with this. She continues on Elavil at 50 mg. She was placed on this for treatment of her headaches. She does still have some headaches but has done much better with that.  Review of Systems     Objective:   Physical Exam Alert and in no distress otherwise not examined       Assessment & Plan:   1. Dysthymia    2. Dysesthesia    3. Hyperlipidemia LDL goal < 100  Lipid panel  4. Hypertension  CBC with Differential, Comprehensive metabolic panel, Lipid panel  The majority of the time spent with her was discussing the interaction between she and her husband. I strongly encouraged her to get involved in counseling even if he does not. She has a good understanding of this. Over 45 minutes spent discussing all these issues. I will also increase her Elavil to 75 mg but cautioned her on a higher dose due to her age. She will let know a couple weeks how she is doing.

## 2011-02-10 ENCOUNTER — Other Ambulatory Visit: Payer: Self-pay

## 2011-03-15 ENCOUNTER — Other Ambulatory Visit: Payer: Self-pay | Admitting: Family Medicine

## 2011-05-05 ENCOUNTER — Other Ambulatory Visit: Payer: Self-pay

## 2011-05-13 ENCOUNTER — Other Ambulatory Visit: Payer: Self-pay | Admitting: Family Medicine

## 2011-05-13 ENCOUNTER — Other Ambulatory Visit: Payer: Self-pay

## 2011-05-13 MED ORDER — AMITRIPTYLINE HCL 50 MG PO TABS: 50.0000 mg | ORAL_TABLET | Freq: Every day | ORAL | Status: DC

## 2011-05-13 MED ORDER — SIMVASTATIN 40 MG PO TABS: 40.0000 mg | ORAL_TABLET | ORAL | Status: DC

## 2011-05-13 MED ORDER — FLUOXETINE HCL 20 MG PO CAPS: 20.0000 mg | ORAL_CAPSULE | Freq: Every day | ORAL | Status: DC

## 2011-05-13 MED ORDER — VALSARTAN-HYDROCHLOROTHIAZIDE 320-25 MG PO TABS: 1.0000 | ORAL_TABLET | Freq: Every day | ORAL | Status: DC

## 2011-05-13 NOTE — Telephone Encounter (Signed)
PT CALLED AND WANTED MED SENT TO [facility] HAD NOT RECEIVED

## 2011-05-13 NOTE — Telephone Encounter (Signed)
Addended by: Ronnald Nian on: 05/13/2011 01:09 PM   Modules accepted: Orders

## 2011-05-13 NOTE — Telephone Encounter (Signed)
PT WOULD LIKE FOR YOU TO MOVE HER BACK DOWN TO 50 MG AMNITRIPTALIN  SHE SAID 75 MG GAVE HER A HEADACHE

## 2011-05-24 ENCOUNTER — Telehealth: Payer: Self-pay | Admitting: Internal Medicine

## 2011-05-24 MED ORDER — FLUOXETINE HCL 20 MG PO CAPS: 20.0000 mg | ORAL_CAPSULE | Freq: Every day | ORAL | Status: DC

## 2011-05-24 MED ORDER — SIMVASTATIN 40 MG PO TABS: 40.0000 mg | ORAL_TABLET | ORAL | Status: DC

## 2011-05-24 MED ORDER — VALSARTAN-HYDROCHLOROTHIAZIDE 320-25 MG PO TABS: 1.0000 | ORAL_TABLET | Freq: Every day | ORAL | Status: DC

## 2011-05-24 MED ORDER — AMITRIPTYLINE HCL 50 MG PO TABS: 50.0000 mg | ORAL_TABLET | Freq: Every day | ORAL | Status: DC

## 2011-05-24 NOTE — Telephone Encounter (Signed)
Refilled elavil 50mg  #90 1rf, prozac 20mg  #90 0rf, simvastatin 40mg  #90 0rf and diovan hct 320-25mg  #90 0rf sent to [facility]

## 2011-06-24 ENCOUNTER — Ambulatory Visit (INDEPENDENT_AMBULATORY_CARE_PROVIDER_SITE_OTHER): Payer: Medicare Other | Admitting: Family Medicine

## 2011-06-24 ENCOUNTER — Encounter: Payer: Self-pay | Admitting: Family Medicine

## 2011-06-24 VITALS — BP 118/70 | HR 60 | Wt 201.0 lb

## 2011-06-24 DIAGNOSIS — M25569 Pain in unspecified knee: Secondary | ICD-10-CM

## 2011-06-24 DIAGNOSIS — M25561 Pain in right knee: Secondary | ICD-10-CM

## 2011-06-24 NOTE — Progress Notes (Signed)
  Subjective:    Patient ID: Stacey Wells, female    DOB: [birth year], 71 y.o.   MRN: [id number]  HPI She complains of a one-week history of right knee pain especially with extension. She has no history of recent injury to this. She has been using Advil 2 pills every 5 hours. She has a history of total knee replacement in 2005.  Review of Systems     Objective:   Physical Exam Full motion of the knee with no tenderness. It is not warm to touch. No laxity noted.       Assessment & Plan:   1. Right knee pain  DG Knee 1-2 Views Right   recommended for Advil 3 times per day. Will probably refer back to her orthopedic surgeon.

## 2011-06-24 NOTE — Patient Instructions (Addendum)
Use 4 Advil 3 times per day and call Monday to let me know how you're doing

## 2011-06-27 ENCOUNTER — Ambulatory Visit
Admission: RE | Admit: 2011-06-27 | Discharge: 2011-06-27 | Disposition: A | Discharge status: Home / Self Care | Payer: Medicare Other | Source: Ambulatory Visit | Attending: Family Medicine | Admitting: Family Medicine

## 2011-06-27 DIAGNOSIS — M25561 Pain in right knee: Secondary | ICD-10-CM

## 2011-08-22 ENCOUNTER — Telehealth: Payer: Self-pay | Admitting: Family Medicine

## 2011-08-22 ENCOUNTER — Other Ambulatory Visit: Payer: Self-pay

## 2011-08-22 MED ORDER — SIMVASTATIN 40 MG PO TABS: ORAL_TABLET | ORAL | Status: DC

## 2011-08-22 MED ORDER — SIMVASTATIN 40 MG PO TABS: 40.0000 mg | ORAL_TABLET | ORAL | Status: DC

## 2011-08-22 NOTE — Telephone Encounter (Signed)
Resent med in with better directions

## 2011-08-26 ENCOUNTER — Other Ambulatory Visit: Payer: Self-pay

## 2011-08-26 MED ORDER — VALSARTAN-HYDROCHLOROTHIAZIDE 320-25 MG PO TABS: 1.0000 | ORAL_TABLET | Freq: Every day | ORAL | Status: DC

## 2011-09-01 ENCOUNTER — Telehealth: Payer: Self-pay | Admitting: Internal Medicine

## 2011-09-01 MED ORDER — FLUOXETINE HCL 20 MG PO CAPS: 20.0000 mg | ORAL_CAPSULE | Freq: Every day | ORAL | Status: DC

## 2011-09-01 NOTE — Telephone Encounter (Signed)
Prozac renewed 

## 2011-09-02 ENCOUNTER — Other Ambulatory Visit: Payer: Self-pay

## 2011-11-22 ENCOUNTER — Ambulatory Visit (INDEPENDENT_AMBULATORY_CARE_PROVIDER_SITE_OTHER): Payer: Medicare Other | Admitting: Family Medicine

## 2011-11-22 VITALS — BP 140/90 | HR 109 | Wt 201.0 lb

## 2011-11-22 DIAGNOSIS — R002 Palpitations: Secondary | ICD-10-CM

## 2011-11-22 DIAGNOSIS — R5383 Other fatigue: Secondary | ICD-10-CM

## 2011-11-22 DIAGNOSIS — R531 Weakness: Secondary | ICD-10-CM

## 2011-11-22 DIAGNOSIS — Z23 Encounter for immunization: Secondary | ICD-10-CM

## 2011-11-22 LAB — CBC WITH DIFFERENTIAL/PLATELET
Eosinophils Relative: 2 % (ref 0–5)
HCT: 38.6 % (ref 36.0–46.0)
Hemoglobin: 13.1 g/dL (ref 12.0–15.0)
Lymphocytes Relative: 40 % (ref 12–46)
MCV: 89.4 fL (ref 78.0–100.0)
Monocytes Absolute: 0.4 10*3/uL (ref 0.1–1.0)
Monocytes Relative: 8 % (ref 3–12)
Neutro Abs: 2.3 10*3/uL (ref 1.7–7.7)
RDW: 13.7 % (ref 11.5–15.5)
WBC: 4.6 10*3/uL (ref 4.0–10.5)

## 2011-11-22 MED ORDER — INFLUENZA VIRUS VACC SPLIT PF IM SUSP: 0.5000 mL | Freq: Once | INTRAMUSCULAR | Status: DC

## 2011-11-22 NOTE — Progress Notes (Signed)
  Subjective:    Patient ID: Stacey Wells, female    DOB: March 03, 1940, 71 y.o.   MRN: [id number]  HPI She is here for evaluation of several episodes that she describes as starting with weakness as well as diaphoresis and some nausea. Upon further questioning she notes that she also had an elevated heart rate during that time and he felt jittery. The symptoms would last several minutes and then go away. It was difficult to get a good history from her but apparently this has occurred on several occasions. All the marked associated with weakness, increased heart rate and diaphoresis. She's had no chest pain, PND.   Review of Systems     Objective:   Physical Exam alert and in no distress. Tympanic membranes and canals are normal. Throat is clear. Tonsils are normal. Neck is supple without adenopathy or thyromegaly. Cardiac exam shows a regular sinus rhythm with a resting heart rate of 96 without murmurs or gallops. Lungs are clear to auscultation.        Assessment & Plan:   1. Weakness  Cardiac event monitor, CBC with Differential, Comprehensive metabolic panel, TSH  2. Palpitations  Cardiac event monitor, CBC with Differential, Comprehensive metabolic panel, TSH  3. Need for prophylactic vaccination and inoculation against influenza  influenza  inactive virus vaccine (FLUZONE/FLUARIX) injection 0.5 mL

## 2011-11-23 LAB — COMPREHENSIVE METABOLIC PANEL
Albumin: 4.3 g/dL (ref 3.5–5.2)
BUN: 34 mg/dL — ABNORMAL HIGH (ref 6–23)
CO2: 25 mEq/L (ref 19–32)
Calcium: 9.3 mg/dL (ref 8.4–10.5)
Chloride: 102 mEq/L (ref 96–112)
Creat: 1.39 mg/dL — ABNORMAL HIGH (ref 0.50–1.10)
Glucose, Bld: 95 mg/dL (ref 70–99)
Potassium: 4.3 mEq/L (ref 3.5–5.3)

## 2011-11-23 LAB — TSH: TSH: 1.092 u[IU]/mL (ref 0.350–4.500)

## 2011-11-24 ENCOUNTER — Telehealth: Payer: Self-pay | Admitting: Internal Medicine

## 2011-11-24 MED ORDER — AMITRIPTYLINE HCL 50 MG PO TABS: 50.0000 mg | ORAL_TABLET | Freq: Every day | ORAL | Status: DC

## 2011-11-24 NOTE — Telephone Encounter (Signed)
Amitriptyline renewed 

## 2011-11-24 NOTE — Addendum Note (Signed)
Addended by: Ronnald Nian on: 11/24/2011 05:20 PM   Modules accepted: Orders

## 2011-11-24 NOTE — Progress Notes (Signed)
  Subjective:    Patient ID: Stacey Wells, female    DOB: April 02, 1940, 71 y.o.   MRN: [id number]  HPI    Review of Systems     Objective:   Physical Exam   EKG shows borderline tachycardia.     Assessment & Plan:  cardiology referral made

## 2011-12-01 ENCOUNTER — Telehealth: Payer: Self-pay | Admitting: Internal Medicine

## 2011-12-01 MED ORDER — SIMVASTATIN 40 MG PO TABS: ORAL_TABLET | ORAL | Status: DC

## 2011-12-01 NOTE — Telephone Encounter (Signed)
sent in chol. Med per fax request

## 2011-12-06 ENCOUNTER — Encounter: Payer: Self-pay | Admitting: Family Medicine

## 2011-12-28 ENCOUNTER — Telehealth: Payer: Self-pay | Admitting: Family Medicine

## 2011-12-28 NOTE — Telephone Encounter (Signed)
I don't remember signing off on this but go ahead and send her to cardiology if their input into this.

## 2011-12-29 ENCOUNTER — Telehealth: Payer: Self-pay

## 2011-12-29 NOTE — Telephone Encounter (Signed)
[facility] Nov 12 at 8 am pt informed

## 2012-01-26 ENCOUNTER — Other Ambulatory Visit (HOSPITAL_COMMUNITY): Payer: Self-pay | Admitting: Cardiovascular Disease

## 2012-01-26 DIAGNOSIS — I10 Essential (primary) hypertension: Secondary | ICD-10-CM

## 2012-01-26 DIAGNOSIS — I471 Supraventricular tachycardia: Secondary | ICD-10-CM

## 2012-01-26 DIAGNOSIS — R011 Cardiac murmur, unspecified: Secondary | ICD-10-CM

## 2012-01-26 DIAGNOSIS — R002 Palpitations: Secondary | ICD-10-CM

## 2012-01-26 DIAGNOSIS — R55 Syncope and collapse: Secondary | ICD-10-CM

## 2012-02-03 ENCOUNTER — Ambulatory Visit (HOSPITAL_COMMUNITY)
Admission: RE | Admit: 2012-02-03 | Discharge: 2012-02-03 | Disposition: A | Discharge status: Home / Self Care | Payer: Medicare Other | Source: Ambulatory Visit | Attending: Cardiovascular Disease | Admitting: Cardiovascular Disease

## 2012-02-03 DIAGNOSIS — I471 Supraventricular tachycardia: Secondary | ICD-10-CM

## 2012-02-03 DIAGNOSIS — R55 Syncope and collapse: Secondary | ICD-10-CM | POA: Insufficient documentation

## 2012-02-03 DIAGNOSIS — R002 Palpitations: Secondary | ICD-10-CM

## 2012-02-03 DIAGNOSIS — I10 Essential (primary) hypertension: Secondary | ICD-10-CM | POA: Insufficient documentation

## 2012-02-03 DIAGNOSIS — R011 Cardiac murmur, unspecified: Secondary | ICD-10-CM

## 2012-02-03 DIAGNOSIS — E785 Hyperlipidemia, unspecified: Secondary | ICD-10-CM | POA: Insufficient documentation

## 2012-02-03 DIAGNOSIS — I2789 Other specified pulmonary heart diseases: Secondary | ICD-10-CM | POA: Insufficient documentation

## 2012-02-03 DIAGNOSIS — I517 Cardiomegaly: Secondary | ICD-10-CM | POA: Insufficient documentation

## 2012-02-03 DIAGNOSIS — I059 Rheumatic mitral valve disease, unspecified: Secondary | ICD-10-CM | POA: Insufficient documentation

## 2012-02-03 MED ORDER — REGADENOSON 0.4 MG/5ML IV SOLN
0.4000 mg | Freq: Once | INTRAVENOUS | Status: AC
  Administered 2012-02-03: 0.4 mg via INTRAVENOUS

## 2012-02-03 MED ORDER — TECHNETIUM TC 99M SESTAMIBI GENERIC - CARDIOLITE
32.0000 | Freq: Once | INTRAVENOUS | Status: AC | PRN
  Administered 2012-02-03: 32 via INTRAVENOUS

## 2012-02-03 MED ORDER — TECHNETIUM TC 99M SESTAMIBI GENERIC - CARDIOLITE
11.0000 | Freq: Once | INTRAVENOUS | Status: AC | PRN
  Administered 2012-02-03: 11 via INTRAVENOUS

## 2012-02-03 NOTE — Progress Notes (Signed)
[facility]   2D echo completed 02/03/2012.   Cindy Tymara Saur, RDCS   

## 2012-02-03 NOTE — Procedures (Addendum)
[facility] [address] [place] [address] [phone]  Cardiology Nuclear Med Study  Stacey Wells is a 71 y.o. female     MRN : [id number]     DOB: [birth year]  Procedure Date: 02/03/2012  Nuclear Med Background Indication for Stress Test:  Evaluation for Ischemia History:  No Prior Cardiac History Cardiac Risk Factors: Family History - CAD, Hypertension, Lipids, NIDDM and Obesity  Symptoms:  Dizziness, Fatigue, Light-Headedness and Near Syncope   Nuclear Pre-Procedure Caffeine/Decaff Intake:  None NPO After: 5:00am   IV Site: L Antecubital  IV 0.9% NS with Angio Cath:  22g  Chest Size (in):  40 IV Started by: Bonnita Levan, RN  Height: 5\' 7"  (1.702 m)  Cup Size: C  BMI:  Body mass index is 31.95 kg/(m^2). Weight:  204 lb (92.534 kg)   Tech Comments:  BP/IV Left Arm Only    Nuclear Med Study 1 or 2 day study: 1 day  Stress Test Type:  Lexiscan  Order Authorizing Provider:  Antony Madura, MD   Resting Radionuclide: Technetium 49m Sestamibi  Resting Radionuclide Dose: 11.0 mCi   Stress Radionuclide:  Technetium 65m Sestamibi  Stress Radionuclide Dose: 32.0 mCi           Stress Protocol Rest HR: 65 Stress HR:87  Rest BP: 151/91 Stress BP: 143/80  Exercise Time (min): n/a METS: n/a          Dose of Adenosine (mg):  n/a Dose of Lexiscan: 0.4 mg  Dose of Atropine (mg): n/a Dose of Dobutamine: n/a mcg/kg/min (at max HR)  Stress Test Technologist: Ernestene Mention, CCT Nuclear Technologist: Gonzella Lex, CNMT   Rest Procedure:  Myocardial perfusion imaging was performed at rest 45 minutes following the intravenous administration of Technetium 18m Sestamibi. Stress Procedure:  The patient received IV Lexiscan 0.4 mg over 15-seconds.  Technetium 32m Sestamibi injected at 30-seconds.  There were no significant changes with Lexiscan.  Quantitative spect images were obtained after a 45 minute delay.  Transient  Ischemic Dilatation (Normal <1.22):  1.09 Lung/Heart Ratio (Normal <0.45):  0.23 QGS EDV:  76.0 ml QGS ESV:  33.0 ml LV Ejection Fraction: 57%        Rest ECG: NSR - Normal EKG  Stress ECG: No significant change from baseline ECG  QPS Raw Data Images:  Normal; no motion artifact; normal heart/lung ratio. Stress Images:  Normal homogeneous uptake in all areas of the myocardium. Rest Images:  Normal homogeneous uptake in all areas of the myocardium. Subtraction (SDS):  No evidence of ischemia.  Impression Exercise Capacity:  Lexiscan with no exercise. BP Response:  Normal blood pressure response. Clinical Symptoms:  No significant symptoms noted. ECG Impression:  No significant ST segment change suggestive of ischemia. Comparison with Prior Nuclear Study: No images to compare  Overall Impression:  Normal stress nuclear study.  LV Wall Motion:  NL LV Function; NL Wall Motion   Stacey Gess, MD  02/03/2012 5:47 PM

## 2012-02-20 ENCOUNTER — Ambulatory Visit (HOSPITAL_COMMUNITY)
Admission: RE | Admit: 2012-02-20 | Discharge: 2012-02-20 | Disposition: A | Discharge status: Home / Self Care | Payer: Medicare Other | Source: Ambulatory Visit | Attending: Cardiovascular Disease | Admitting: Cardiovascular Disease

## 2012-02-20 DIAGNOSIS — I10 Essential (primary) hypertension: Secondary | ICD-10-CM | POA: Insufficient documentation

## 2012-02-20 NOTE — Progress Notes (Signed)
Renal duplex completed

## 2012-05-29 ENCOUNTER — Telehealth: Payer: Self-pay | Admitting: Internal Medicine

## 2012-05-29 MED ORDER — SIMVASTATIN 40 MG PO TABS: ORAL_TABLET | ORAL | Status: DC

## 2012-05-29 NOTE — Telephone Encounter (Signed)
Sent in chol.med  

## 2012-05-29 NOTE — Telephone Encounter (Signed)
Refill request for simvastain 40mg  #90 to [facility]

## 2012-06-14 ENCOUNTER — Encounter: Payer: Self-pay | Admitting: Cardiovascular Disease

## 2012-08-27 ENCOUNTER — Telehealth: Payer: Self-pay | Admitting: Internal Medicine

## 2012-08-27 NOTE — Telephone Encounter (Signed)
Med refill came in for pt from [facility] on several meds. i have faxed back stating it was denied that pt needs an appt

## 2012-09-07 ENCOUNTER — Encounter: Payer: Self-pay | Admitting: Family Medicine

## 2012-09-07 ENCOUNTER — Ambulatory Visit (INDEPENDENT_AMBULATORY_CARE_PROVIDER_SITE_OTHER): Payer: Medicare Other | Admitting: Family Medicine

## 2012-09-07 VITALS — BP 124/80 | HR 84 | Wt 202.0 lb

## 2012-09-07 DIAGNOSIS — E785 Hyperlipidemia, unspecified: Secondary | ICD-10-CM

## 2012-09-07 DIAGNOSIS — R413 Other amnesia: Secondary | ICD-10-CM

## 2012-09-07 DIAGNOSIS — Z79899 Other long term (current) drug therapy: Secondary | ICD-10-CM

## 2012-09-07 DIAGNOSIS — F341 Dysthymic disorder: Secondary | ICD-10-CM

## 2012-09-07 DIAGNOSIS — I10 Essential (primary) hypertension: Secondary | ICD-10-CM

## 2012-09-07 LAB — CBC WITH DIFFERENTIAL/PLATELET
Basophils Absolute: 0 10*3/uL (ref 0.0–0.1)
HCT: 39.6 % (ref 36.0–46.0)
Hemoglobin: 13.5 g/dL (ref 12.0–15.0)
Lymphocytes Relative: 29 % (ref 12–46)
Monocytes Absolute: 0.5 10*3/uL (ref 0.1–1.0)
Neutro Abs: 4 10*3/uL (ref 1.7–7.7)
RDW: 14.1 % (ref 11.5–15.5)
WBC: 6.6 10*3/uL (ref 4.0–10.5)

## 2012-09-07 MED ORDER — FLUOXETINE HCL 20 MG PO CAPS: 20.0000 mg | ORAL_CAPSULE | Freq: Every day | ORAL | Status: DC

## 2012-09-07 MED ORDER — SIMVASTATIN 40 MG PO TABS: ORAL_TABLET | ORAL | Status: DC

## 2012-09-07 MED ORDER — AMITRIPTYLINE HCL 50 MG PO TABS: 50.0000 mg | ORAL_TABLET | Freq: Every day | ORAL | Status: DC

## 2012-09-07 NOTE — Progress Notes (Signed)
  Subjective:    Patient ID: Stacey Wells, female    DOB: 1940-08-27, 72 y.o.   MRN: [id number]  HPI She complains of a two-year history of dropping things that have become worse in the last month. She states that she can be holding on to set up he is in suddenly they will drop from her hands. No numbness, tingling or pain in her arms. No lower extremity weakness noted. She has also noted memory issues in that she will be starting a sentence and forget what she is talking about. She can also walk from one room to another not remember why she went. She notes that these symptoms worsen when she gets stressed. She has no other questions or concerns. She does need refills on her medications. She is taking a full amitriptyline to help with sleep. She wakes up refreshed. She states that her stress is actually diminished. She is now handling her husband's mood swings much better by ignoring him.   Review of Systems     Objective:   Physical Exam alert and in no distress. EOMI. Other cranial nerves grossly intact. Motor sensory and DTRs are normal. Tympanic membranes and canals are normal. Throat is clear. Tonsils are normal. Neck is supple without adenopathy or thyromegaly. Cardiac exam shows a regular sinus rhythm without murmurs or gallops. Lungs are clear to auscultation.        Assessment & Plan:  Dysthymia - Plan: amitriptyline (ELAVIL) 50 MG tablet, FLUoxetine (PROZAC) 20 MG capsule  Hyperlipidemia LDL goal < 100 - Plan: simvastatin (ZOCOR) 40 MG tablet, Lipid panel  Hypertension  Memory loss - Plan: Vitamin B12, Folate  Encounter for long-term (current) use of other medications - Plan: CBC with Differential, Comprehensive metabolic panel, Lipid panel  I will have her hold her Zocor to see if this has any impact on her memory issues. May possibly need to refer to neurology.

## 2012-09-07 NOTE — Patient Instructions (Signed)
Stop the simvastatin for a week and see if that helps with the memory

## 2012-09-08 LAB — COMPREHENSIVE METABOLIC PANEL
ALT: 15 U/L (ref 0–35)
AST: 25 U/L (ref 0–37)
Albumin: 4.2 g/dL (ref 3.5–5.2)
BUN: 33 mg/dL — ABNORMAL HIGH (ref 6–23)
Calcium: 9.6 mg/dL (ref 8.4–10.5)
Chloride: 103 mEq/L (ref 96–112)
Potassium: 4.6 mEq/L (ref 3.5–5.3)
Total Protein: 7 g/dL (ref 6.0–8.3)

## 2012-09-08 LAB — FOLATE: Folate: 13.6 ng/mL

## 2012-09-08 LAB — LIPID PANEL: HDL: 63 mg/dL (ref >39)

## 2012-09-08 LAB — VITAMIN B12: Vitamin B-12: 507 pg/mL (ref 211–911)

## 2012-09-10 NOTE — Progress Notes (Signed)
Quick Note:  Sent pt letter of lab results ______

## 2012-09-17 ENCOUNTER — Telehealth: Payer: Self-pay | Admitting: Family Medicine

## 2012-09-17 MED ORDER — ATORVASTATIN CALCIUM 10 MG PO TABS: 10.0000 mg | ORAL_TABLET | Freq: Every day | ORAL | Status: DC

## 2012-09-17 NOTE — Telephone Encounter (Signed)
Pt called and states to tell you mentally she feels great since going off of the Zocor.     She said you wanted to know where to send her meds to [facility] at [address]  [place]?Ginette Otto

## 2012-09-17 NOTE — Telephone Encounter (Signed)
Let her know that I called Lipitor and in its okay to wait till it comes and to start on it.

## 2012-09-18 NOTE — Telephone Encounter (Signed)
PT INFORMED WORD FOR WORD SHE VERBALIZED UNDERSTANDING 

## 2012-09-21 ENCOUNTER — Telehealth: Payer: Self-pay | Admitting: Cardiovascular Disease

## 2012-09-21 MED ORDER — VALSARTAN 320 MG PO TABS: 320.0000 mg | ORAL_TABLET | Freq: Every day | ORAL | Status: DC

## 2012-09-21 NOTE — Telephone Encounter (Signed)
Patient is requesting for a bran name ,Diovan 320mg  tablet , and is requesting it to be daw1.Marland Kitchen Please call

## 2012-09-21 NOTE — Telephone Encounter (Signed)
Refill sent DAW.  Call to pharmacy to confirm received.

## 2012-09-26 ENCOUNTER — Telehealth: Payer: Self-pay | Admitting: Internal Medicine

## 2012-09-26 ENCOUNTER — Other Ambulatory Visit: Payer: Self-pay

## 2012-09-26 NOTE — Telephone Encounter (Signed)
Left message word for word  

## 2012-09-26 NOTE — Telephone Encounter (Signed)
Pt called and wanted to know if the simvastatin 40mg  is the same as atorvastatin 10mg  you put her on. And she wants to know will it not effect her the same way the simvastatin did since it is the generic that is being sent

## 2012-09-26 NOTE — Telephone Encounter (Signed)
Let her know that it is the same class of drug and hopefully will not cause the same symptoms. The different strength has to do with potency of the medication.

## 2012-09-27 ENCOUNTER — Other Ambulatory Visit: Payer: Self-pay | Admitting: *Deleted

## 2012-09-27 DIAGNOSIS — R0989 Other specified symptoms and signs involving the circulatory and respiratory systems: Secondary | ICD-10-CM

## 2012-09-27 DIAGNOSIS — G459 Transient cerebral ischemic attack, unspecified: Secondary | ICD-10-CM

## 2012-10-10 ENCOUNTER — Ambulatory Visit (HOSPITAL_COMMUNITY)
Admission: RE | Admit: 2012-10-10 | Discharge: 2012-10-10 | Disposition: A | Discharge status: Home / Self Care | Payer: Medicare Other | Source: Ambulatory Visit | Attending: Cardiovascular Disease | Admitting: Cardiovascular Disease

## 2012-10-10 DIAGNOSIS — G459 Transient cerebral ischemic attack, unspecified: Secondary | ICD-10-CM | POA: Insufficient documentation

## 2012-10-10 NOTE — Progress Notes (Signed)
Carotid Duplex Completed. °Brianna L Mazza,RVT °

## 2012-10-11 ENCOUNTER — Telehealth: Payer: Self-pay | Admitting: Family Medicine

## 2012-10-11 MED ORDER — ROSUVASTATIN CALCIUM 5 MG PO TABS: 5.0000 mg | ORAL_TABLET | Freq: Every day | ORAL | Status: DC

## 2012-10-11 NOTE — Telephone Encounter (Signed)
Pt called and stated that she was having problems with medication. She stated she was switched from zocor to lipitor because of problems with side effects. She states as soon as she stated on Lipitor the symptoms for lightheaded and back pain started almost immediately. She stopped taking lipitor and symptoms stopped. She wanted you to know that she is not taking any medication for this issue. [facility] uses [facility] [place].

## 2012-10-11 NOTE — Telephone Encounter (Signed)
She is having difficulty with Lipitor causing the same CNS symptoms as Zocor. I will leave a sample of Crestor for her to try

## 2012-10-23 ENCOUNTER — Telehealth: Payer: Self-pay | Admitting: Family Medicine

## 2012-10-23 MED ORDER — COLESEVELAM HCL 3.75 G PO PACK: 1.0000 | PACK | Freq: Every day | ORAL | Status: DC

## 2012-10-23 NOTE — Telephone Encounter (Signed)
SENT WEL-CHOL IN PER JCL PT WAS INFORMED AND VERBALIZED UNDERSTANDING

## 2012-10-23 NOTE — Telephone Encounter (Signed)
she is statin intolerant . At her know that the only thing we have to offer is WelChol. Have her take one packet per day. Recheck here in 2 months.

## 2012-10-25 ENCOUNTER — Telehealth: Payer: Self-pay | Admitting: Internal Medicine

## 2012-10-25 NOTE — Telephone Encounter (Signed)
Pt states that the med colesevelam 3.75mg  is $310 dollars and pt can't afford that. She was wanting to know if you have a sample or can call in something cheaper. Pt will be leaving shortly and will return on tuesday

## 2012-10-25 NOTE — Telephone Encounter (Signed)
Unfortunately there is nothing cheaper. At this point only dietary modification

## 2012-10-25 NOTE — Telephone Encounter (Signed)
LEFT MESSAGE WORD FOR WORD 

## 2012-10-31 ENCOUNTER — Telehealth: Payer: Self-pay | Admitting: Family Medicine

## 2012-10-31 NOTE — Telephone Encounter (Signed)
Pt called and states you recommended communication counseling for their marriage.  Dorene Sorrow and Kori have agreed to this counseling.  Please set this up for them.

## 2012-10-31 NOTE — Telephone Encounter (Signed)
Pt was notified that this is something that they have to set up any counseling has to be done by pt pt verbalized understanding

## 2012-12-27 ENCOUNTER — Other Ambulatory Visit: Payer: Self-pay

## 2013-02-04 ENCOUNTER — Other Ambulatory Visit: Payer: Self-pay

## 2013-02-04 DIAGNOSIS — Z853 Personal history of malignant neoplasm of breast: Secondary | ICD-10-CM

## 2013-02-04 DIAGNOSIS — Z1231 Encounter for screening mammogram for malignant neoplasm of breast: Secondary | ICD-10-CM

## 2013-02-04 DIAGNOSIS — Z9889 Other specified postprocedural states: Secondary | ICD-10-CM

## 2013-02-07 ENCOUNTER — Ambulatory Visit (INDEPENDENT_AMBULATORY_CARE_PROVIDER_SITE_OTHER): Payer: Medicare Other | Admitting: Family Medicine

## 2013-02-07 ENCOUNTER — Encounter: Payer: Self-pay | Admitting: Family Medicine

## 2013-02-07 VITALS — BP 120/70 | HR 93 | Wt 204.0 lb

## 2013-02-07 DIAGNOSIS — Z789 Other specified health status: Secondary | ICD-10-CM

## 2013-02-07 DIAGNOSIS — M25579 Pain in unspecified ankle and joints of unspecified foot: Secondary | ICD-10-CM

## 2013-02-07 DIAGNOSIS — Z23 Encounter for immunization: Secondary | ICD-10-CM

## 2013-02-07 DIAGNOSIS — F341 Dysthymic disorder: Secondary | ICD-10-CM

## 2013-02-07 DIAGNOSIS — M25572 Pain in left ankle and joints of left foot: Secondary | ICD-10-CM

## 2013-02-07 DIAGNOSIS — I10 Essential (primary) hypertension: Secondary | ICD-10-CM

## 2013-02-07 DIAGNOSIS — Z630 Problems in relationship with spouse or partner: Secondary | ICD-10-CM

## 2013-02-07 MED ORDER — NEBIVOLOL HCL 5 MG PO TABS: 5.0000 mg | ORAL_TABLET | Freq: Every day | ORAL | Status: DC

## 2013-02-07 MED ORDER — AMITRIPTYLINE HCL 50 MG PO TABS: 50.0000 mg | ORAL_TABLET | Freq: Every day | ORAL | Status: DC

## 2013-02-07 MED ORDER — VALSARTAN 160 MG PO TABS: 160.0000 mg | ORAL_TABLET | Freq: Every day | ORAL | Status: DC

## 2013-02-07 MED ORDER — FLUOXETINE HCL 40 MG PO CAPS: 40.0000 mg | ORAL_CAPSULE | Freq: Every day | ORAL | Status: DC

## 2013-02-07 NOTE — Progress Notes (Signed)
   Subjective:    Patient ID: Stacey Wells, female    DOB: 1940-07-26, 72 y.o.   MRN: [id number]  HPI She is here for medication check. She does have statin intolerance. It caused difficulty with dizziness.. When she stopped the medication her symptoms did call a. She has been on amitriptyline initially given for headaches and for sleep. She tried to cut back on this but has had difficulty. She states that when she goes to bed her mind continues to race on various thoughts. She has been under a lot of stress dealing with her husband. She is involved in counseling however he is not. She would like her Prozac increased. She continues on her other medications and is having no difficulty. She does complain of a four-month history of left foot pain.   Review of Systems     Objective:   Physical Exam Alert and in no distress. Left foot exam shows good motion without palpable tenderness. X-ray shows no bony abnormalities.       Assessment & Plan:  Statin intolerance  Dysthymia - Plan: amitriptyline (ELAVIL) 50 MG tablet  Hypertension  Pain in joint, ankle and foot, left  Marital stress  I will increase her Prozac to 40 mg. Encouraged her to continue in counseling. Recommend arch supports for the foot and if further difficulty, referral will be made. Hopefully with time we can cut her back on the Elavil. Recheck here in one month. Also prescription for Zostavax shot given. Her blood pressure medications will also be renewed.

## 2013-02-07 NOTE — Patient Instructions (Signed)
Use arch supports for several weeks and if no improvement call me and I will work for you

## 2013-02-21 DIAGNOSIS — I639 Cerebral infarction, unspecified: Secondary | ICD-10-CM

## 2013-02-21 HISTORY — DX: Cerebral infarction, unspecified: I63.9

## 2013-03-11 ENCOUNTER — Ambulatory Visit (INDEPENDENT_AMBULATORY_CARE_PROVIDER_SITE_OTHER): Payer: Medicare Other | Admitting: Family Medicine

## 2013-03-11 ENCOUNTER — Encounter: Payer: Self-pay | Admitting: Family Medicine

## 2013-03-11 VITALS — BP 112/74 | HR 74 | Wt 209.0 lb

## 2013-03-11 DIAGNOSIS — M79672 Pain in left foot: Secondary | ICD-10-CM

## 2013-03-11 DIAGNOSIS — F341 Dysthymic disorder: Secondary | ICD-10-CM

## 2013-03-11 DIAGNOSIS — M79609 Pain in unspecified limb: Secondary | ICD-10-CM

## 2013-03-11 DIAGNOSIS — Z79899 Other long term (current) drug therapy: Secondary | ICD-10-CM

## 2013-03-11 MED ORDER — FLUOXETINE HCL 40 MG PO CAPS: 40.0000 mg | ORAL_CAPSULE | Freq: Every day | ORAL | Status: DC

## 2013-03-11 NOTE — Progress Notes (Signed)
   Subjective:    Patient ID: ZAKIYAH DIOP, female    DOB: October 28, 1940, 73 y.o.   MRN: [id number]  HPI She is here for recheck. She is now on 40 mg of Prozac and notes a huge improvement in her overall demeanor. She is much more calm and is interacting much better with her husband. She also has had difficulty with left foot pain and now is wearing orthotics. She finds that these are quite useful.   Review of Systems     Objective:   Physical Exam Alert and in no distress with appropriate affect.       Assessment & Plan:  Dysthymia - Plan: FLUoxetine (PROZAC) 40 MG capsule  Foot pain, left  Encounter for long-term (current) use of other medications - Plan: FLUoxetine (PROZAC) 40 MG capsule  I will continue her on present dosing of Prozac indefinitely since it is working quite well. Encouraged her to continue to use the orthotics.

## 2013-03-12 ENCOUNTER — Ambulatory Visit
Admission: RE | Admit: 2013-03-12 | Discharge: 2013-03-12 | Disposition: A | Discharge status: Home / Self Care | Payer: Medicare Other | Source: Ambulatory Visit

## 2013-03-12 DIAGNOSIS — Z9889 Other specified postprocedural states: Secondary | ICD-10-CM

## 2013-03-12 DIAGNOSIS — Z853 Personal history of malignant neoplasm of breast: Secondary | ICD-10-CM

## 2013-03-12 DIAGNOSIS — Z1231 Encounter for screening mammogram for malignant neoplasm of breast: Secondary | ICD-10-CM

## 2013-04-07 IMAGING — CR DG KNEE 1-2V*R*
2 series · 2 of 2 positions shown · non-contrast
Comparison: None.

***ADDENDUM*** CREATED: 06/27/2011 [DATE]

Correction to the impression of the dictated report of 06/27/2011:
The impression should read "right total knee replacement in good
position and alignment.  No acute abnormality."
***END ADDENDUM*** SIGNED BY: Asucely Manuel Tecu, M.D.
CLINICAL DATA: Right knee replacement in 6336, some right knee
pain, no injury
RIGHT KNEE - 1-2 VIEW

[view not recorded (1 of 2)]
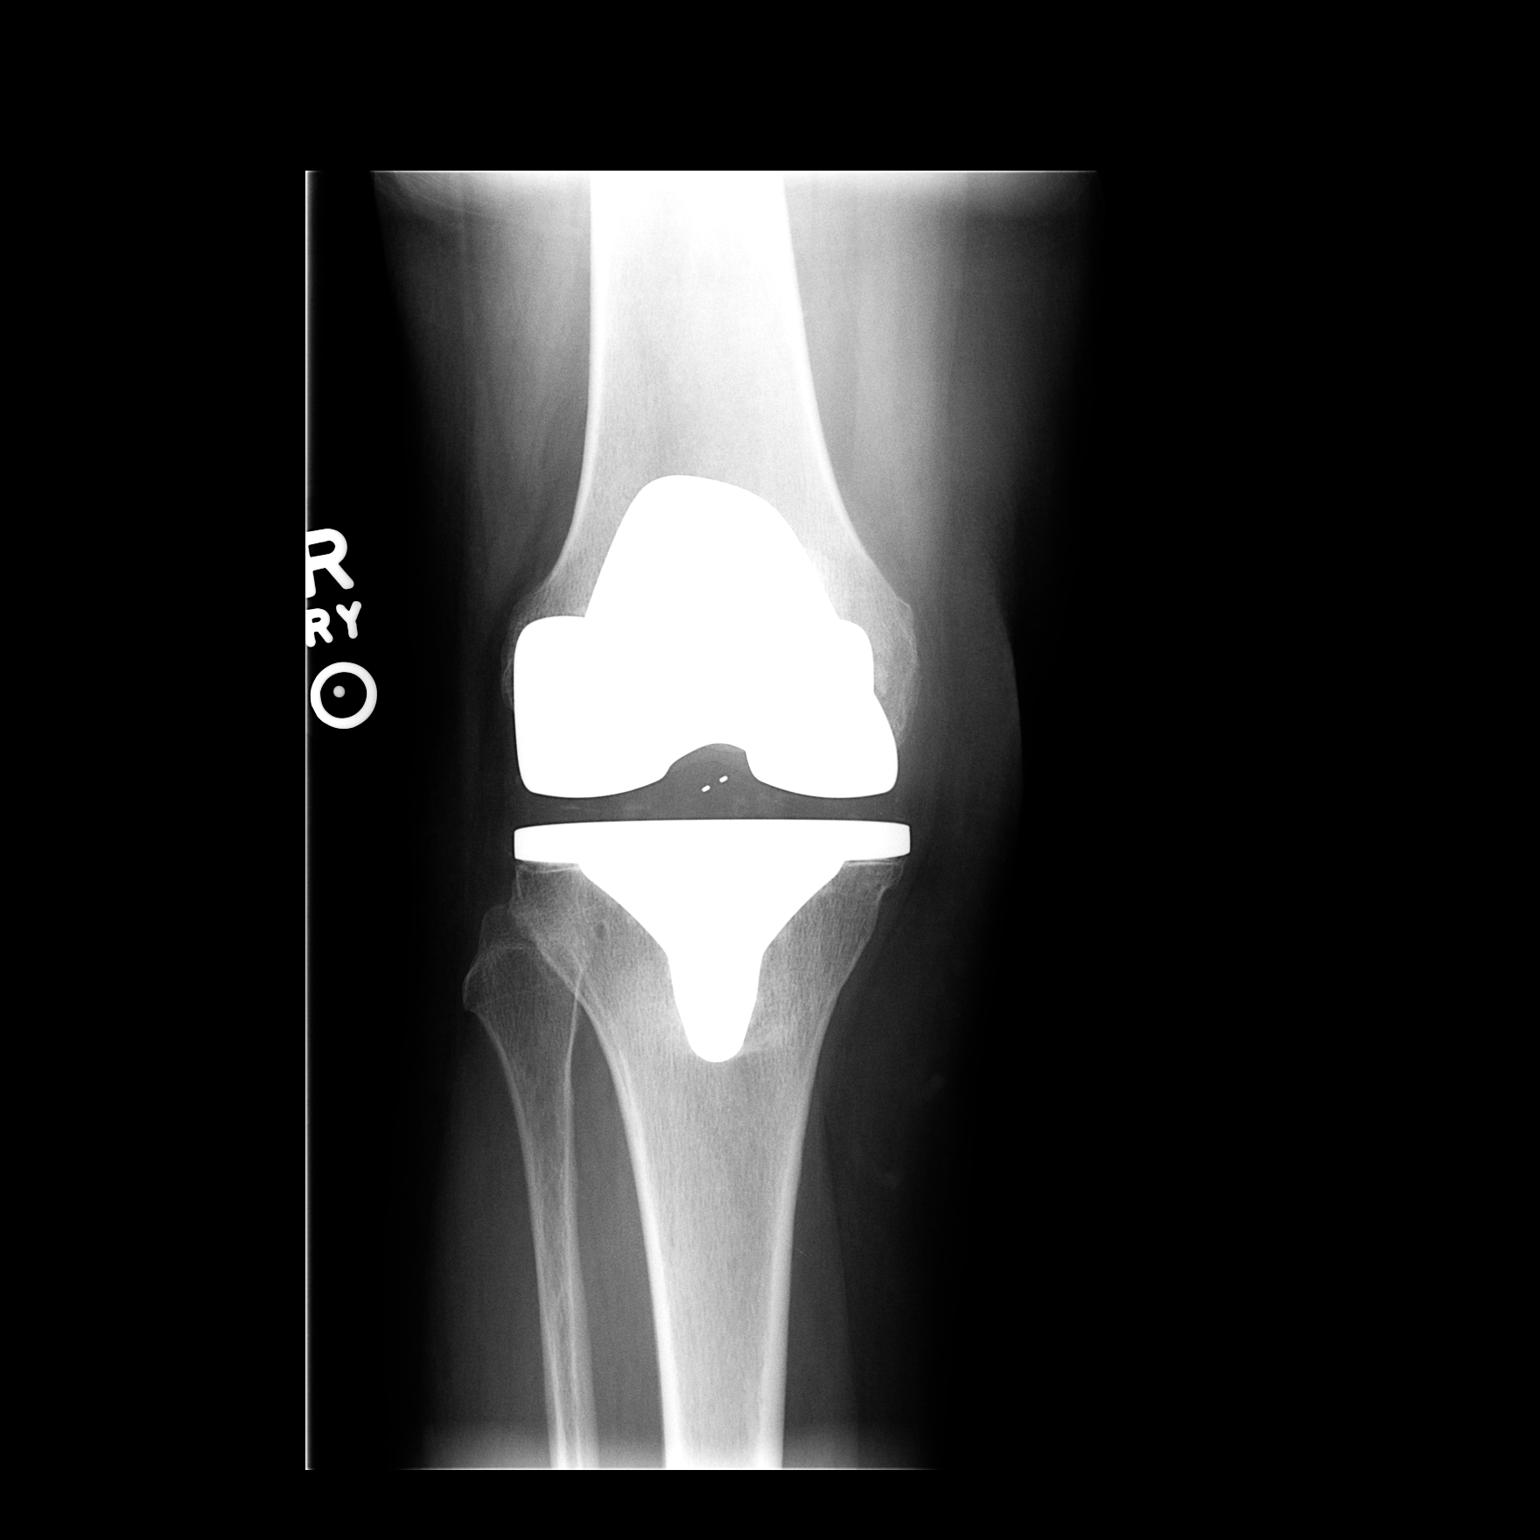

[view not recorded (2 of 2)]
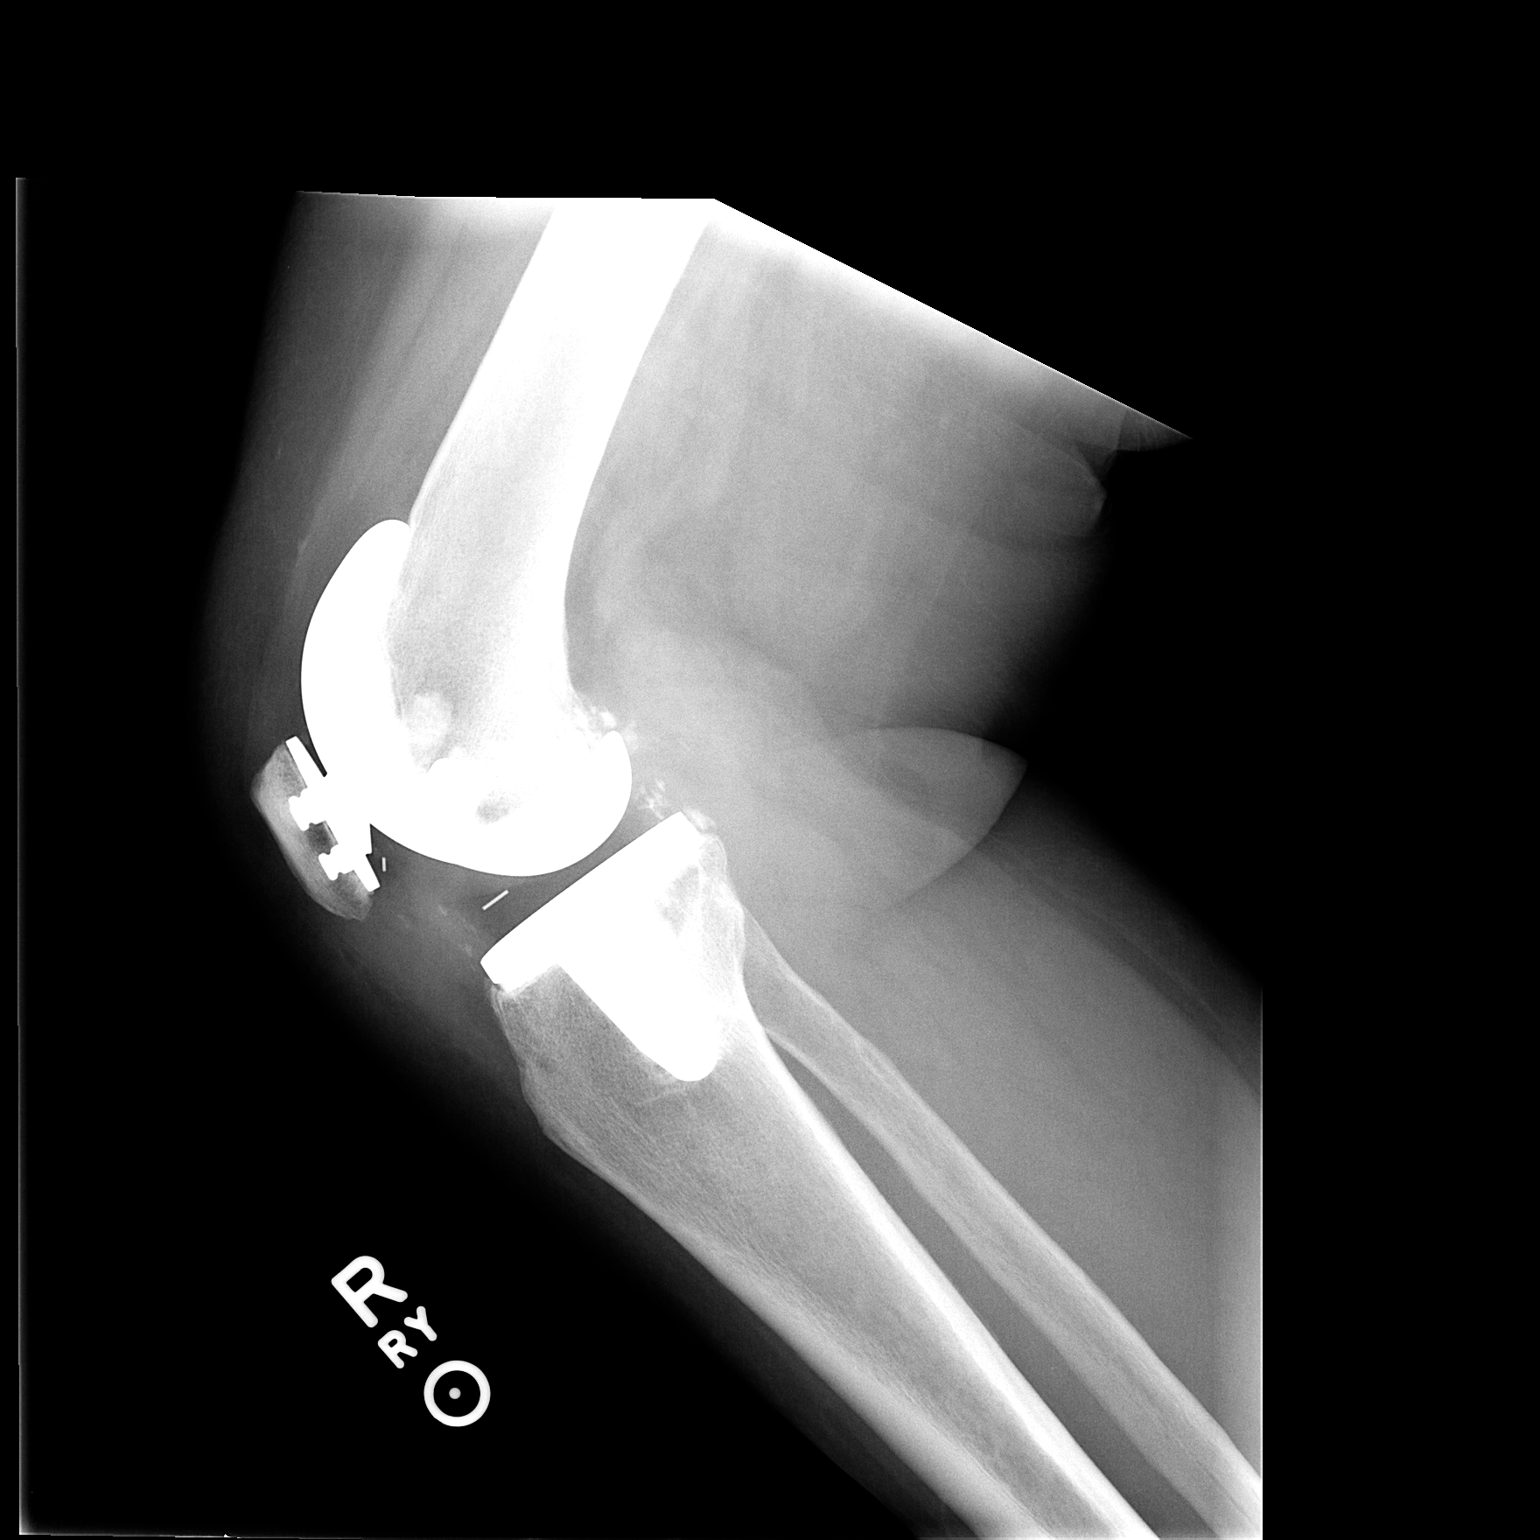

[2 of 2 positions shown; findings below may reference images not displayed]

FINDINGS: Two views of the right knee show the femoral and tibial
components of the right total knee replacement to be in good
position and alignment.  The patellar component also is well
positioned on the lateral view.  No acute bony abnormality is seen.
No effusion is noted.
IMPRESSION: Right total hip replacement in good position and alignment.  No
acute abnormality.

## 2013-04-29 ENCOUNTER — Ambulatory Visit (INDEPENDENT_AMBULATORY_CARE_PROVIDER_SITE_OTHER): Payer: Medicare Other | Admitting: Family Medicine

## 2013-04-29 ENCOUNTER — Encounter: Payer: Self-pay | Admitting: Family Medicine

## 2013-04-29 VITALS — BP 114/74 | HR 80 | Temp 99.2°F | Ht 66.0 in | Wt 209.0 lb

## 2013-04-29 DIAGNOSIS — Z5320 Procedure and treatment not carried out because of patient's decision for unspecified reasons: Secondary | ICD-10-CM

## 2013-04-29 DIAGNOSIS — Z5321 Procedure and treatment not carried out due to patient leaving prior to being seen by health care provider: Secondary | ICD-10-CM

## 2013-04-29 NOTE — Progress Notes (Signed)
Patient left prior to being seen by physcian

## 2013-04-30 ENCOUNTER — Encounter: Payer: Self-pay | Admitting: Family Medicine

## 2013-04-30 ENCOUNTER — Ambulatory Visit (INDEPENDENT_AMBULATORY_CARE_PROVIDER_SITE_OTHER): Payer: Medicare Other | Admitting: Family Medicine

## 2013-04-30 VITALS — BP 112/80 | HR 84 | Temp 98.1°F | Wt 210.0 lb

## 2013-04-30 DIAGNOSIS — F341 Dysthymic disorder: Secondary | ICD-10-CM

## 2013-04-30 DIAGNOSIS — J45909 Unspecified asthma, uncomplicated: Secondary | ICD-10-CM

## 2013-04-30 MED ORDER — AMOXICILLIN 875 MG PO TABS: 875.0000 mg | ORAL_TABLET | Freq: Two times a day (BID) | ORAL | Status: DC

## 2013-04-30 MED ORDER — ALBUTEROL SULFATE HFA 108 (90 BASE) MCG/ACT IN AERS: 2.0000 | INHALATION_SPRAY | Freq: Four times a day (QID) | RESPIRATORY_TRACT | Status: DC | PRN

## 2013-04-30 NOTE — Patient Instructions (Signed)
Take all the medicine ;if not totally back to normal let me know

## 2013-04-30 NOTE — Progress Notes (Signed)
   Subjective:    Patient ID: Stacey Wells, female    DOB: September 13, 1940, 73 y.o.   MRN: [id number]  HPI 4 days ago she had the onset of cough, headache, fever , wheezing, fatigue. Her husband recently has symptoms and was placed on Amoxil. She does not smoke and has no allergies. She continues on her 40 mg of Prozac and states that this has greatly helped her deal with handling her emotions. She is comfortable with the present dosing and wants to maintain this.   Review of Systems     Objective:   Physical Exam alert and in no distress. Tympanic membranes and canals are normal. Throat is clear. Tonsils are normal. Neck is supple without adenopathy or thyromegaly. Cardiac exam shows a regular sinus rhythm without murmurs or gallops. Lungs show expiratory wheezing      Assessment & Plan:  Asthmatic bronchitis - Plan: albuterol (PROVENTIL HFA;VENTOLIN HFA) 108 (90 BASE) MCG/ACT inhaler, amoxicillin (AMOXIL) 875 MG tablet  Dysthymia  continue on Prozac. She is to call me if she does not completely improve on the Amoxil.

## 2013-05-09 ENCOUNTER — Telehealth: Payer: Self-pay | Admitting: Internal Medicine

## 2013-05-09 DIAGNOSIS — J45909 Unspecified asthma, uncomplicated: Secondary | ICD-10-CM

## 2013-05-09 MED ORDER — AMOXICILLIN 875 MG PO TABS: 875.0000 mg | ORAL_TABLET | Freq: Two times a day (BID) | ORAL | Status: DC

## 2013-05-09 NOTE — Telephone Encounter (Signed)
Pt states that she still has a cough and would like another round of antibiotics. Send to [facility]

## 2013-06-06 ENCOUNTER — Encounter: Payer: Self-pay | Admitting: Family Medicine

## 2013-06-06 ENCOUNTER — Ambulatory Visit (INDEPENDENT_AMBULATORY_CARE_PROVIDER_SITE_OTHER): Payer: Medicare Other | Admitting: Family Medicine

## 2013-06-06 VITALS — BP 120/72 | HR 80 | Wt 209.0 lb

## 2013-06-06 DIAGNOSIS — S7000XA Contusion of unspecified hip, initial encounter: Secondary | ICD-10-CM

## 2013-06-06 DIAGNOSIS — S7001XA Contusion of right hip, initial encounter: Secondary | ICD-10-CM

## 2013-06-06 DIAGNOSIS — R2689 Other abnormalities of gait and mobility: Secondary | ICD-10-CM

## 2013-06-06 DIAGNOSIS — R5381 Other malaise: Secondary | ICD-10-CM

## 2013-06-06 DIAGNOSIS — R29818 Other symptoms and signs involving the nervous system: Secondary | ICD-10-CM

## 2013-06-06 NOTE — Progress Notes (Deleted)
   Subjective:    Patient ID: Stacey Wells, female    DOB: [birth year], 73 y.o.   MRN: [id number]  CC: "I fall all of the time"  HPI  The patient reports that she has fallen twice in the past month and six times in the last year. The patient's two most recent falls were on the steps of her patio while sweeping and also on the steps of her home. The patient states that neither fall was from a high hight but that she hit her head and hip. Falls in the past have involved ladders and other steps as well. Overall the patient feels she is able to walk on flat surfaces without difficulty. She describes falling as a process wherein she puts one foot down, feels unstable and then is unable to catch herself with the other foot. She also feels that her falling may be the result of her not lifting her feet high enough as she walks. Falling is not preceded by any symptoms, specifically dizziness, lightheadedness, nausea or weakness. She does not describe any post-fall symptoms, other than pain from landing, specifically headache, blurry vision or shortness of breath and chest pain. The patient denies any recent vision changes and has visited an eye doctor recently. 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   The patient is also concerned today about residual pain from her most recent fall. This fall occurred four days ago on her back patio and when she fell, she describes hitting her head and hip. The patient was also concerned that she had a concussion due to a subsequent headache, however that has resolved since her chiropractor adjusted her neck. The patient still has sharp pain upon standing. The pain does not radiate but does include her gluteal area and lateral hip. She does not have pain at rest, after using her hot tub or when she takes advil. She has been taking three advil three times a day. The pain is worst when standing after a long period of rest.     Review of Systems is negative except per HPI.     Objective:   Physical Exam  Constitutional: Patient is oriented to person, place, and time and well-developed, well-nourished, and in no distress. HENT: Head: Normocephalic and atraumatic.  Cardiovascular: Normal rate, regular rhythm.  MSK: Negative get-up-and-go, normal gait without obvious shuffling step, foot striking abnormality or instability. Hip flexion intact but illicit pain. Non-tender to palpation at the hip and sacro-illiac joint.  SKIN: No ecchymosis noted     Assessment & Plan:   Contusion of hip, right  Loss of balance  Her loss of balance is most likely due to an overall decrease in core strength and stability of the lower extremities specifically. Patient should begin activites aimed to increase her overall physical well-being including going to the [facility] to enroll in silver sneakers or chair yoga. May consider physical therapy in there in no reduction in her rate of falls.  Her hip pain most likely represents a contusion from her fall. Recommend conservative therapy with heat and stretching. The patient can use up to 4 Advil three times a day for discomfort, but should use the minimus amount possible to control her symptoms.

## 2013-06-06 NOTE — Progress Notes (Signed)
   Subjective:    Patient ID: Stacey Wells, female    DOB: 1940-12-25, 73 y.o.   MRN: [id number]  CC: "I fall all of the time"  HPI  The patient reports that she has fallen twice in the past month and six times in the last year. The patient's two most recent falls were on the steps of her patio while sweeping and also on the steps of her home. The patient states that neither fall was from a high hight but that she hit her head and hip. Falls in the past have involved ladders and other steps as well. Overall the patient feels she is able to walk on flat surfaces without difficulty. She describes falling as a process wherein she puts one foot down, feels unstable and then is unable to catch herself with the other foot. She also feels that her falling may be the result of her not lifting her feet high enough as she walks. Falling is not preceded by any symptoms, specifically dizziness, lightheadedness, nausea or weakness. She does not describe any post-fall symptoms, other than pain from landing, specifically headache, blurry vision or shortness of breath and chest pain. The patient denies any recent vision changes and has visited an eye doctor recently. 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   The patient is also concerned today about residual pain from her most recent fall. This fall occurred four days ago on her back patio and when she fell, she describes hitting her head and hip. The patient was also concerned that she had a concussion due to a subsequent headache, however that has resolved since her chiropractor adjusted her neck. The patient still has sharp pain upon standing. The pain does not radiate but does include her gluteal area and lateral hip. She does not have pain at rest, after using her hot tub or when she takes advil. She has been taking three advil three times a day. The pain is worst when standing after a long period of rest.     Review of Systems is negative except per HPI.     Objective:   Physical Exam  Constitutional: Patient is oriented to person, place, and time and well-developed, well-nourished, and in no distress. HENT: Head: Normocephalic and atraumatic.  Cardiovascular: Normal rate, regular rhythm.  MSK: Negative get-up-and-go, normal gait without obvious shuffling step, foot striking abnormality or instability. Hip flexion intact but illicit pain. Non-tender to palpation at the hip and sacro-illiac joint.  SKIN: No ecchymosis noted     Assessment & Plan:   Contusion of hip, right  Loss of balance  Physical deconditioning  Her loss of balance is most likely due to an overall decrease in core strength and stability of the lower extremities specifically. Patient should begin activites aimed to increase her overall physical well-being including going to the [facility] to enroll in silver sneakers or chair yoga. May consider physical therapy in there in no reduction in her rate of falls.  Her hip pain most likely represents a contusion from her fall. Recommend conservative therapy with heat and stretching. The patient can use up to 4 Advil three times a day for discomfort, but should use the minimus amount possible to control her symptoms.

## 2013-06-06 NOTE — Patient Instructions (Addendum)
Please apply heat to the area for 20 minutes three times a day. You may use UP TO 4 advil three times a day. You do not need to use this amount, use the least amount that prevents discomfort. We recommend physical therapy at this time to help improve your core strength, a good general rehab program. Also consider going to the [facility] to enroll in silver sneakers or chair yoga to increase your overall physical strength, balance and well-being.

## 2013-07-01 ENCOUNTER — Ambulatory Visit (INDEPENDENT_AMBULATORY_CARE_PROVIDER_SITE_OTHER): Payer: Medicare Other | Admitting: Neurology

## 2013-07-01 ENCOUNTER — Encounter: Payer: Self-pay | Admitting: Neurology

## 2013-07-01 VITALS — BP 159/90 | HR 100 | Ht 65.75 in | Wt 204.0 lb

## 2013-07-01 DIAGNOSIS — R2681 Unsteadiness on feet: Secondary | ICD-10-CM

## 2013-07-01 DIAGNOSIS — G609 Hereditary and idiopathic neuropathy, unspecified: Secondary | ICD-10-CM

## 2013-07-01 DIAGNOSIS — F09 Unspecified mental disorder due to known physiological condition: Secondary | ICD-10-CM

## 2013-07-01 DIAGNOSIS — R269 Unspecified abnormalities of gait and mobility: Secondary | ICD-10-CM

## 2013-07-01 DIAGNOSIS — R4189 Other symptoms and signs involving cognitive functions and awareness: Secondary | ICD-10-CM

## 2013-07-01 NOTE — Patient Instructions (Signed)
Overall you are doing fairly well but I do want to suggest a few things today:   Remember to drink plenty of fluid, eat healthy meals and do not skip any meals. Try to eat protein with a every meal and eat a healthy snack such as fruit or nuts in between meals. Try to keep a regular sleep-wake schedule and try to exercise daily, particularly in the form of walking, 20-30 minutes a day, if you can.   As far as diagnostic testing:  1)please have some blood work completed today 2)I would like you to have a MRI of your brain completed, you will be called to schedule this  We will follow up once the workup is completed. Please call us with any interim questions, concerns, problems, updates or refill requests.   My clinical assistant and will answer any of your questions and relay your messages to me and also relay most of my messages to you.   Our phone number is [phone]. We also have an after hours call service for urgent matters and there is a physician on-call for urgent questions. For any emergencies you know to call 911 or go to the nearest emergency room

## 2013-07-01 NOTE — Progress Notes (Signed)
[facility]    Provider:  Dr Janann Colonel Referring Provider: Denita Lung, MD Primary Care Physician:  Wyatt Haste, MD  CC:  Gait instability and cognitive decline  HPI:  Stacey Wells is a 73 y.o. female here as a referral from Dr. Redmond [facility] for gait instability and cognitive decline  In the past month notes some severe imbalance, she reports having multiple falls, notes that it is getting progressively worse. Notes that she will trip over small objects and will also just fall over, typically notes she will fall forwards. Notes she veers to the left often. Denies any light headed sensation, no vertigo. Notes some subjective weakness in her legs, notes cramping of her muscles. Denies any generalized bradykinesia. Notes difficulty holding onto objects. Notes a mild rest tremor in bilateral hands. Notes some shrinking of her handwriting. No known REM behavior disorder. Notes some difficulty with short term memory, has trouble recalling conversations and things that just happened. Notes she gets lost frequently when driving.   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 No known strokes or TIAs. Per patient she has a lumbar fracture, she is followed by ortho for this and is wearing a brace. She is taking tramadol as needed for pain.   Review of Systems: Out of a complete 14 system review, the patient complains of only the following symptoms, and all other reviewed systems are negative. + confusion, memory loss, weakness, restless legs  History   Social History  . Marital Status: Married    Spouse Name: N/A    Number of Children: N/A  . Years of Education: N/A   Occupational History  . Not on file.   Social History Main Topics  . Smoking status: Never Smoker   . Smokeless tobacco: Not on file  . Alcohol Use: No  . Drug Use: No  . Sexual Activity: Not Currently   Other Topics Concern  . Not on file   Social History Narrative   Married, 4  children   Right handed   12 th grade   1 cup daily    No family history on file.  Past Medical History  Diagnosis Date  . Arthritis   . Migraine headache   . Dyslipidemia   . Hypertension   . 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 Dysthymia   . Glucose intolerance (impaired glucose tolerance)   . 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 Renal insufficiency     No past surgical history on file.  Current Outpatient Prescriptions  Medication Sig Dispense Refill  . albuterol (PROVENTIL HFA;VENTOLIN HFA) 108 (90 BASE) MCG/ACT inhaler Inhale 2 puffs into the lungs every 6 (six) hours as needed for wheezing or shortness of breath.  1 Inhaler  0  . amitriptyline (ELAVIL) 50 MG tablet Take 1 tablet (50 mg total) by mouth at bedtime.  90 tablet  0  . FLUoxetine (PROZAC) 40 MG capsule Take 1 capsule (40 mg total) by mouth daily.  90 capsule  3  . ibuprofen (ADVIL,MOTRIN) 200 MG tablet Take 200 mg by mouth every 6 (six) hours as needed.      . nebivolol (BYSTOLIC) 5 MG tablet Take 1 tablet (5 mg total) by mouth daily.  90 tablet  3  . traMADol (ULTRAM) 50 MG tablet       . valsartan (DIOVAN) 160 MG tablet Take 1 tablet (160 mg total) by mouth daily.  90 tablet  3   No current facility-administered medications for this visit.    Allergies as of 07/01/2013 - Review Complete 07/01/2013  Allergen Reaction Noted  . Statins  04/29/2013    Vitals: BP 159/90  Pulse 100  Ht 5' 5.75" (1.67 m)  Wt 204 lb (92.534 kg)  BMI 33.18 kg/m2 Last Weight:  Wt Readings from Last 1 Encounters:  07/01/13 204 lb (92.534 kg)   Last Height:   Ht Readings from Last 1 Encounters:  07/01/13 5' 5.75" (1.67 m)     Physical exam: Exam: Gen: NAD, conversant Eyes: anicteric sclerae, moist conjunctivae HENT: Atraumatic, oropharynx clear Neck: Trachea midline; supple,  Lungs: CTA, no wheezing, rales, rhonic                          CV: RRR, no MRG Abdomen: Soft, non-tender;  Extremities: No  peripheral edema  Skin: Normal temperature, no rash,  Psych: Appropriate affect, pleasant  Neuro: MS: AA&Ox2, appropriately interactive, normal affect, pressured speech, gets confused easily, difficulty following multi-step commands  CN: PERRL, EOMI no nystagmus, no ptosis, sensation intact to LT V1-V3 bilat, face symmetric, no weakness, hearing grossly intact, palate elevates symmetrically, shoulder shrug 5/5 bilat,  tongue protrudes midline, no fasiculations noted.  Motor: normal bulk and tone Strength: 5/5  In all extremities  Coord: mild resting tremor bilateral hands, minimal intention tremor, no bradykinesia noted  Reflexes: symmetrical, bilat downgoing toes  Sens: decreased vibration, temp and PP bilateral LE. Intact proprioception  Gait: wide based, unsteady, unable to tandem, wobbles with Romberg but does not fall. Retropulses and falls with pull test   Assessment:  After physical and neurologic examination, review of laboratory studies, imaging, neurophysiology testing and pre-existing records, assessment will be reviewed on the problem list.  Plan:  Treatment plan and additional workup will be reviewed under Problem List.  1)Gait instability 2)Cognitive decline  73y/o woman with history of lumbar fracture presenting for initial evaluation of gait instability with multiple recent falls. Unclear etiology of her falls. On exam she does appear to have some signs of peripheral neuropathy and cognitive decline. Patient describes an acute onset of symptoms which raises question of a possible CVA. Will check MRI brain, lab workup for peripheral neuropathy and cognitive decline. Will check MOCA at next visit. Referral placed to PT for gait instability. Follow up once workup completed.   Jim Like, DO  [facility] [address] [place], [place] [address]  Phone [phone] Fax [phone]

## 2013-07-02 NOTE — Progress Notes (Signed)
Quick Note:  Called patient to inform her of normal labs, with slightly elevated blood sugars, patient expressed understanding and will follow up with her primary care provider. ______

## 2013-07-04 ENCOUNTER — Ambulatory Visit: Payer: Medicare Other | Admitting: Neurology

## 2013-07-04 ENCOUNTER — Telehealth: Payer: Self-pay | Admitting: Family Medicine

## 2013-07-04 LAB — PROTEIN ELECTROPHORESIS
A/G Ratio: 1.2 (ref 0.7–2.0)
ALBUMIN ELP: 3.7 g/dL (ref 3.2–5.6)
ALPHA 1: 0.2 g/dL (ref 0.1–0.4)
ALPHA 2: 0.7 g/dL (ref 0.4–1.2)
BETA: 1 g/dL (ref 0.6–1.3)
Gamma Globulin: 1.2 g/dL (ref 0.5–1.6)
Globulin, Total: 3.2 g/dL (ref 2.0–4.5)
TOTAL PROTEIN: 6.9 g/dL (ref 6.0–8.5)

## 2013-07-04 LAB — TSH: TSH: 1.26 u[IU]/mL (ref 0.450–4.500)

## 2013-07-04 LAB — VITAMIN B12: Vitamin B-12: 499 pg/mL (ref 211–946)

## 2013-07-04 LAB — HGB A1C W/O EAG: Hgb A1c MFr Bld: 5.8 % — ABNORMAL HIGH (ref 4.8–5.6)

## 2013-07-04 LAB — METHYLMALONIC ACID, SERUM: METHYLMALONIC ACID: 716 nmol/L — AB (ref 0–378)

## 2013-07-04 MED ORDER — ALPRAZOLAM 0.25 MG PO TABS: ORAL_TABLET | ORAL | Status: DC

## 2013-07-04 NOTE — Telephone Encounter (Signed)
Call in 6 of the 0.25 mg Xanax, and have her take one prior to the MRI and if she is still anxious have her take another one

## 2013-07-04 NOTE — Telephone Encounter (Signed)
Called in med 

## 2013-07-07 ENCOUNTER — Ambulatory Visit
Admission: RE | Admit: 2013-07-07 | Discharge: 2013-07-07 | Disposition: A | Discharge status: Home / Self Care | Payer: Medicare Other | Source: Ambulatory Visit | Attending: Neurology | Admitting: Neurology

## 2013-07-07 DIAGNOSIS — R2681 Unsteadiness on feet: Secondary | ICD-10-CM

## 2013-07-07 DIAGNOSIS — G609 Hereditary and idiopathic neuropathy, unspecified: Secondary | ICD-10-CM

## 2013-07-10 ENCOUNTER — Telehealth: Payer: Self-pay | Admitting: Neurology

## 2013-07-10 NOTE — Telephone Encounter (Signed)
MRI results called by Charisse March 07/10/13

## 2013-07-10 NOTE — Progress Notes (Signed)
Quick Note:  Spoke with patient and informed her of normal MRI, patient questions what is making her fall a lot. Please advise. ______

## 2013-07-10 NOTE — Telephone Encounter (Signed)
Patient's husband calling to get interpretation on MRI readings--please call--thank you.

## 2013-10-29 ENCOUNTER — Telehealth: Payer: Self-pay | Admitting: Internal Medicine

## 2013-10-29 DIAGNOSIS — F341 Dysthymic disorder: Secondary | ICD-10-CM

## 2013-10-29 MED ORDER — AMITRIPTYLINE HCL 50 MG PO TABS: 50.0000 mg | ORAL_TABLET | Freq: Every day | ORAL | Status: DC

## 2013-10-29 NOTE — Telephone Encounter (Signed)
Request request for amitriptyline 50mg  #90 to [facility]

## 2013-11-04 ENCOUNTER — Telehealth: Payer: Self-pay | Admitting: Family Medicine

## 2013-11-04 DIAGNOSIS — F341 Dysthymic disorder: Secondary | ICD-10-CM

## 2013-11-04 MED ORDER — AMITRIPTYLINE HCL 50 MG PO TABS: 50.0000 mg | ORAL_TABLET | Freq: Every day | ORAL | Status: DC

## 2013-11-04 NOTE — Telephone Encounter (Signed)
Amitriptyline renewed

## 2013-11-21 ENCOUNTER — Other Ambulatory Visit (INDEPENDENT_AMBULATORY_CARE_PROVIDER_SITE_OTHER): Payer: Medicare Other

## 2013-11-21 DIAGNOSIS — Z23 Encounter for immunization: Secondary | ICD-10-CM

## 2013-11-29 ENCOUNTER — Telehealth: Payer: Self-pay | Admitting: Internal Medicine

## 2013-11-29 NOTE — Telephone Encounter (Signed)
Refill request for valsartan 160mg  #90 to [facility]

## 2013-12-02 ENCOUNTER — Other Ambulatory Visit: Payer: Self-pay

## 2013-12-02 MED ORDER — VALSARTAN 160 MG PO TABS: 160.0000 mg | ORAL_TABLET | Freq: Every day | ORAL | Status: DC

## 2013-12-05 ENCOUNTER — Other Ambulatory Visit: Payer: Self-pay | Admitting: Family Medicine

## 2013-12-05 ENCOUNTER — Telehealth: Payer: Self-pay | Admitting: Family Medicine

## 2013-12-09 ENCOUNTER — Other Ambulatory Visit: Payer: Self-pay | Admitting: Family Medicine

## 2013-12-09 MED ORDER — VALSARTAN 160 MG PO TABS
160.0000 mg | ORAL_TABLET | Freq: Every day | ORAL | Status: DC
Start: 2013-12-09 — End: 2014-02-27

## 2013-12-09 NOTE — Telephone Encounter (Signed)
Rx refill sent.

## 2014-01-28 ENCOUNTER — Ambulatory Visit (INDEPENDENT_AMBULATORY_CARE_PROVIDER_SITE_OTHER): Payer: Medicare Other | Admitting: Family Medicine

## 2014-01-28 DIAGNOSIS — M6530 Trigger finger, unspecified finger: Secondary | ICD-10-CM

## 2014-01-28 DIAGNOSIS — M79641 Pain in right hand: Secondary | ICD-10-CM

## 2014-01-28 NOTE — Progress Notes (Signed)
   Subjective:    Patient ID: Stacey Wells, female    DOB: 07/19/1940, 73 y.o.   MRN: [id number]  HPI She complains of a several month history of intermittent difficulty with a feeling as if the right fourth finger is getting stuck in a locked position. Recently this has become more bothersome.   Review of Systems     Objective:   Physical Exam Exam of the right hand does show full motion of the fourth finger with no evidence of trigger at the present time. There is some slight tenderness to palpation over the palmar surface of the PIP joint.       Assessment & Plan:  Hand pain, right - Plan: Ambulatory referral to Hand Surgery  Trigger finger, acquired - Plan: Ambulatory referral to Orthopedic Surgery

## 2014-02-27 ENCOUNTER — Telehealth: Payer: Self-pay | Admitting: Family Medicine

## 2014-02-27 MED ORDER — VALSARTAN 160 MG PO TABS: 160.0000 mg | ORAL_TABLET | Freq: Every day | ORAL | Status: DC

## 2014-02-27 NOTE — Telephone Encounter (Signed)
Looked and saw that diovan was done back in October #90 with 1 refill. Since she had a 90 day supply left i will send in 90 days to new pharmacy and pt will need an appt to get anymore refills

## 2014-03-28 ENCOUNTER — Telehealth: Payer: Self-pay | Admitting: Family Medicine

## 2014-03-28 NOTE — Telephone Encounter (Signed)
Pt called and stated she has a NEW mail order PHARMACY, [place] also scheduled a medcheck appt for later this month. She is requesting refills on Diovan, Prozac, Amitriptyline and Bystolic sent to this NEW pharmacy. Pharmacy phone number is [phone].

## 2014-03-31 ENCOUNTER — Other Ambulatory Visit: Payer: Self-pay

## 2014-03-31 DIAGNOSIS — F411 Generalized anxiety disorder: Secondary | ICD-10-CM

## 2014-03-31 DIAGNOSIS — F341 Dysthymic disorder: Secondary | ICD-10-CM

## 2014-03-31 MED ORDER — AMITRIPTYLINE HCL 50 MG PO TABS: 50.0000 mg | ORAL_TABLET | Freq: Every day | ORAL | Status: DC

## 2014-03-31 MED ORDER — NEBIVOLOL HCL 5 MG PO TABS: 5.0000 mg | ORAL_TABLET | Freq: Every day | ORAL | Status: DC

## 2014-03-31 MED ORDER — FLUOXETINE HCL 40 MG PO CAPS: 40.0000 mg | ORAL_CAPSULE | Freq: Every day | ORAL | Status: DC

## 2014-03-31 MED ORDER — VALSARTAN 160 MG PO TABS: 160.0000 mg | ORAL_TABLET | Freq: Every day | ORAL | Status: DC

## 2014-04-08 ENCOUNTER — Encounter: Payer: Self-pay | Admitting: Family Medicine

## 2014-04-14 ENCOUNTER — Ambulatory Visit (INDEPENDENT_AMBULATORY_CARE_PROVIDER_SITE_OTHER): Payer: PPO | Admitting: Family Medicine

## 2014-04-14 ENCOUNTER — Encounter: Payer: Self-pay | Admitting: Family Medicine

## 2014-04-14 VITALS — BP 130/88 | HR 84 | Ht 65.5 in | Wt 210.0 lb

## 2014-04-14 DIAGNOSIS — Z630 Problems in relationship with spouse or partner: Secondary | ICD-10-CM

## 2014-04-14 DIAGNOSIS — F411 Generalized anxiety disorder: Secondary | ICD-10-CM

## 2014-04-14 DIAGNOSIS — Z789 Other specified health status: Secondary | ICD-10-CM

## 2014-04-14 DIAGNOSIS — I10 Essential (primary) hypertension: Secondary | ICD-10-CM

## 2014-04-14 DIAGNOSIS — E785 Hyperlipidemia, unspecified: Secondary | ICD-10-CM

## 2014-04-14 DIAGNOSIS — F341 Dysthymic disorder: Secondary | ICD-10-CM

## 2014-04-14 LAB — CBC WITH DIFFERENTIAL/PLATELET
Basophils Absolute: 0 10*3/uL (ref 0.0–0.1)
Basophils Relative: 0 % (ref 0–1)
EOS ABS: 0.1 10*3/uL (ref 0.0–0.7)
Eosinophils Relative: 2 % (ref 0–5)
HCT: 41.4 % (ref 36.0–46.0)
Hemoglobin: 13.8 g/dL (ref 12.0–15.0)
LYMPHS ABS: 1.5 10*3/uL (ref 0.7–4.0)
Lymphocytes Relative: 30 % (ref 12–46)
MCH: 30.2 pg (ref 26.0–34.0)
MCHC: 33.3 g/dL (ref 30.0–36.0)
MCV: 90.6 fL (ref 78.0–100.0)
MPV: 11 fL (ref 8.6–12.4)
Monocytes Absolute: 0.5 10*3/uL (ref 0.1–1.0)
Monocytes Relative: 9 % (ref 3–12)
Neutro Abs: 3 10*3/uL (ref 1.7–7.7)
Neutrophils Relative %: 59 % (ref 43–77)
Platelets: 197 10*3/uL (ref 150–400)
RBC: 4.57 MIL/uL (ref 3.87–5.11)
RDW: 13.8 % (ref 11.5–15.5)
WBC: 5 10*3/uL (ref 4.0–10.5)

## 2014-04-14 LAB — LIPID PANEL
Cholesterol: 330 mg/dL — ABNORMAL HIGH (ref 0–200)
HDL: 69 mg/dL (ref >=46)
LDL CALC: 233 mg/dL — AB (ref 0–99)
TRIGLYCERIDES: 140 mg/dL (ref <150)
Total CHOL/HDL Ratio: 4.8 Ratio
VLDL: 28 mg/dL (ref 0–40)

## 2014-04-14 LAB — COMPREHENSIVE METABOLIC PANEL
ALK PHOS: 75 U/L (ref 39–117)
ALT: 19 U/L (ref 0–35)
AST: 24 U/L (ref 0–37)
Albumin: 3.9 g/dL (ref 3.5–5.2)
BILIRUBIN TOTAL: 0.6 mg/dL (ref 0.2–1.2)
BUN: 27 mg/dL — ABNORMAL HIGH (ref 6–23)
CO2: 26 mEq/L (ref 19–32)
Calcium: 9.7 mg/dL (ref 8.4–10.5)
Chloride: 100 mEq/L (ref 96–112)
Creat: 1.19 mg/dL — ABNORMAL HIGH (ref 0.50–1.10)
Glucose, Bld: 91 mg/dL (ref 70–99)
Potassium: 4.4 mEq/L (ref 3.5–5.3)
Sodium: 137 mEq/L (ref 135–145)
TOTAL PROTEIN: 7 g/dL (ref 6.0–8.3)

## 2014-04-14 MED ORDER — NEBIVOLOL HCL 5 MG PO TABS: 5.0000 mg | ORAL_TABLET | Freq: Every day | ORAL | Status: DC

## 2014-04-14 MED ORDER — FLUOXETINE HCL 40 MG PO CAPS: 40.0000 mg | ORAL_CAPSULE | Freq: Every day | ORAL | Status: DC

## 2014-04-14 MED ORDER — AMITRIPTYLINE HCL 50 MG PO TABS: 50.0000 mg | ORAL_TABLET | Freq: Every day | ORAL | Status: DC

## 2014-04-14 MED ORDER — VALSARTAN 160 MG PO TABS: 160.0000 mg | ORAL_TABLET | Freq: Every day | ORAL | Status: DC

## 2014-04-14 NOTE — Progress Notes (Signed)
   Subjective:    Patient ID: Stacey Wells, female    DOB: 1940/09/03, 74 y.o.   MRN: [id number]  HPI She is here for a recheck. She does have a history of statin intolerance being tried on several statin drugs without success. She continues on her blood pressure medications and is having no difficulty with this. She continues on Prozac as well as Elavil. She continues have difficulty with marital stress and has been involved in counseling. She states that she has good days and bad in regard to this. She keeps herself busy around the house doing most of the work herself has her husband is not very helpful at least according to her.   Review of Systems     Objective:   Physical Exam Alert and in no distress. Tympanic membranes and canals are normal. Pharyngeal area is normal. Neck is supple without adenopathy or thyromegaly. Cardiac exam shows a regular sinus rhythm without murmurs or gallops. Lungs are clear to auscultation.        Assessment & Plan:  Statin intolerance - Plan: CBC with Differential/Platelet, Comprehensive metabolic panel, Lipid panel  Essential hypertension - Plan: CBC with Differential/Platelet, Comprehensive metabolic panel, nebivolol (BYSTOLIC) 5 MG tablet, valsartan (DIOVAN) 160 MG tablet  Hyperlipidemia with target LDL less than 100 - Plan: Lipid panel  Dysthymia - Plan: CBC with Differential/Platelet, Comprehensive metabolic panel, FLUoxetine (PROZAC) 40 MG capsule, amitriptyline (ELAVIL) 50 MG tablet  Marital stress  Anxiety state - Plan: FLUoxetine (PROZAC) 40 MG capsule, amitriptyline (ELAVIL) 50 MG tablet  over 25 minutes spent discussing her overall health and well-being especially in regard to dealing with her husband. Strongly encouraged her to keep involved in counseling to help her deal with the situation.

## 2014-04-15 MED ORDER — EZETIMIBE 10 MG PO TABS: 10.0000 mg | ORAL_TABLET | Freq: Every day | ORAL | Status: DC

## 2014-04-15 NOTE — Progress Notes (Signed)
   Subjective:    Patient ID: Stacey Wells, female    DOB: 1940/05/15, 74 y.o.   MRN: [id number]  HPI    Review of Systems     Objective:   Physical Exam        Assessment & Plan:  Her cholesterol numbers were high. I will try her on Zetia. She will come by and pick up a sample and let me know how works.

## 2014-04-15 NOTE — Addendum Note (Signed)
Addended by: Denita Lung on: 04/15/2014 04:27 PM   Modules accepted: Orders

## 2014-05-14 ENCOUNTER — Ambulatory Visit (INDEPENDENT_AMBULATORY_CARE_PROVIDER_SITE_OTHER): Payer: PPO | Admitting: Family Medicine

## 2014-05-14 ENCOUNTER — Encounter: Payer: Self-pay | Admitting: Family Medicine

## 2014-05-14 VITALS — BP 160/100 | Wt 216.0 lb

## 2014-05-14 DIAGNOSIS — T887XXA Unspecified adverse effect of drug or medicament, initial encounter: Secondary | ICD-10-CM | POA: Diagnosis not present

## 2014-05-14 DIAGNOSIS — R609 Edema, unspecified: Secondary | ICD-10-CM | POA: Diagnosis not present

## 2014-05-14 DIAGNOSIS — T50905A Adverse effect of unspecified drugs, medicaments and biological substances, initial encounter: Secondary | ICD-10-CM

## 2014-05-14 NOTE — Progress Notes (Signed)
   Subjective:    Patient ID: Stacey SPAKE, female    DOB: July 31, 1940, 74 y.o.   MRN: [id number]  HPI She complains of a ten-day history of intermittent left footand hand swelling as well as swelling of the tongue but not necessarily the lips. It can last approximately 40 minutes and then go away.she has had no new medications. She's had no headache, weakness numbness or tingling. She also states that she continues to have difficulty with falling. She mentioned 2 falls when she essentially fell out of bed for no particular reason. She has had difficulty with this for a long time and has seen neurology about it. She also states that she stopped taking the Zetia because it made her hallucinate.   Review of Systems     Objective:   Physical Exam Alert and in no distress. EOMI. Cerebellar testing negative. DTRs normal. Tympanic membranes and canals are normal. Pharyngeal area is normal. Neck is supple without adenopathy or thyromegaly. Cardiac exam shows a regular sinus rhythm without murmurs or gallops. Lungs are clear to auscultation. ower extremities do show more swelling in the left than on the right and is only 1+ pitting.       Assessment & Plan:  Adverse drug reaction, initial encounter  Edema The exact cause of this is unclear. I will refer to neurology for further evaluation of her symptoms. Symptoms sound like they could be angioedema however she is not on any medication it would do that. I did encourage her to take a picture of the swelling when it occurs.

## 2014-05-15 ENCOUNTER — Encounter: Payer: Self-pay | Admitting: Neurology

## 2014-05-15 ENCOUNTER — Ambulatory Visit (INDEPENDENT_AMBULATORY_CARE_PROVIDER_SITE_OTHER): Payer: PPO | Admitting: Neurology

## 2014-05-15 VITALS — BP 166/97 | HR 82 | Resp 16 | Ht 65.75 in | Wt 213.8 lb

## 2014-05-15 DIAGNOSIS — S060X1S Concussion with loss of consciousness of 30 minutes or less, sequela: Secondary | ICD-10-CM

## 2014-05-15 DIAGNOSIS — H93239 Hyperacusis, unspecified ear: Secondary | ICD-10-CM | POA: Insufficient documentation

## 2014-05-15 DIAGNOSIS — H93231 Hyperacusis, right ear: Secondary | ICD-10-CM | POA: Diagnosis not present

## 2014-05-15 DIAGNOSIS — S060X9A Concussion with loss of consciousness of unspecified duration, initial encounter: Secondary | ICD-10-CM | POA: Insufficient documentation

## 2014-05-15 DIAGNOSIS — C50911 Malignant neoplasm of unspecified site of right female breast: Secondary | ICD-10-CM

## 2014-05-15 DIAGNOSIS — G3184 Mild cognitive impairment, so stated: Secondary | ICD-10-CM

## 2014-05-15 DIAGNOSIS — Z853 Personal history of malignant neoplasm of breast: Secondary | ICD-10-CM | POA: Insufficient documentation

## 2014-05-15 DIAGNOSIS — R296 Repeated falls: Secondary | ICD-10-CM

## 2014-05-15 MED ORDER — DONEPEZIL HCL 10 MG PO TABS: 10.0000 mg | ORAL_TABLET | Freq: Every day | ORAL | Status: DC

## 2014-05-15 MED ORDER — DONEPEZIL HCL 5 MG PO TABS: 5.0000 mg | ORAL_TABLET | Freq: Every day | ORAL | Status: DC

## 2014-05-15 NOTE — Progress Notes (Signed)
[facility]    Referring Provider: Denita Lung, MD Primary Care Physician:  Wyatt Haste, MD  CC:  I fall frequently, and suffer injuries.     HPI:  Stacey Wells is a 74 y.o. female here as a re-revaluation l from Dr. [employer] [employer] for gait instability .  Stacey Wells , a married , right handed caucasian patient  from [place] reports today that she has continued to fall frequently. Apparently she was seen by Dr. Janann Colonel for the same concern in addition at his visit with him she had mentioned a cognitive concern. After being evaluated by the movement disorder specialist, she was diagnosed  with Parkinson's syndrome,  which terrifies her, of course.  She was also sent for an MRI of the brain which returned normal for age with some chronic microvascular ischemia that was only mild. The patient has an interesting story to her frequent falls she seems sometimes to have a warning that she knows she may follow but some of her falls occur out of the blue and very sudden. She has suffered injuries and she is visibly bruised for example on the lateral epicondyle of the right elbow, she describes large hematomata on her buttocks and on the abdomen left more than right. The vertebral fracture in the past. She has only once hit her head during a fall but at that time hit the chest of drawers with a hard surface. One time she fell outside and hit the back of her head on the grass, but she had trouble to get back up. The falls appear independent of time I'm of day, or surrounding, of proximity to mealtimes, of fluid intake, this seems to be no special activity that may trigger it.  They can occur as the patient stands or sits or walks. Independent of the falls the patient has had some episodes of sudden weakness, one of them she describes as feeling a wave of heat coming over her she was extremely hot. She describes that she slid from a chair onto the bed and then couldn't find the  strength to get up again and she was confused about her surroundings. It may have taken her several minutes to reorient herself to the surroundings. She has also started to use night lights as she feels more disoriented in the dark. She was referred for cardiac monitoring and this resulted in normal tracing.   She had a normal EEG at this office.   She reports ongoing confusion, getting lost in a familiar environment and gets parts of conversations. Her husband has noted that there is a cognitive problem and he has mentioned is here today. My nurse Lovey Newcomer performed a Montral cognitive assessment test today with the patient. He was able to name 3 animals, she was perfectly fine in drawing a clock face and placing the hands of the clock at 11:10. She had trouble copying a every image and the Trail Making Test gave her difficulties. Her attention was excellent and she was able to repeat a list of digits without problem. She performed 2 out of 5 subtractions correctly, but she did not word for word repeat the sentences given to her.  MOCA was was tested and the patient was able minutes several minutes after the initial test was performed to recall  4 out of 5 words. She appears anxious, afraid to fail.    Note Jim Like, DO  note below May 2015   In the past month notes some severe imbalance,  she reports having multiple falls, notes that it is getting progressively worse. Notes that she will trip over small objects and will also just fall over, typically notes she will fall forwards. Notes she veers to the left often. Denies any light headed sensation, no vertigo. Notes some subjective weakness in her legs, notes cramping of her muscles. Denies any generalized bradykinesia. Notes difficulty holding onto objects. Notes a mild rest tremor in bilateral hands. Notes some shrinking of her handwriting. No known REM behavior disorder. Notes some difficulty with short term memory, has trouble recalling  conversations and things that just happened. Notes she gets lost frequently when driving.   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 No known strokes or TIAs. Per patient she has a lumbar fracture, she is followed by ortho for this and is wearing a brace. She is taking tramadol as needed for pain.   Review of Systems: Out of a complete 14 system review, the patient complains of only the following symptoms, and all other reviewed systems are negative. + confusion, memory loss, weakness, restless legs  History   Social History  . Marital Status: Married    Spouse Name: N/A  . Number of Children: N/A  . Years of Education: N/A   Occupational History  . Not on file.   Social History Main Topics  . Smoking status: Never Smoker   . Smokeless tobacco: Not on file  . Alcohol Use: 0.0 oz/week    0 Standard drinks or equivalent per week     Comment: occa  . Drug Use: No  . Sexual Activity: Not Currently   Other Topics Concern  . Not on file   Social History Narrative   Married, 4 children   Right handed   12 th grade   1 cup daily    History reviewed. No pertinent family history.  Past Medical History  Diagnosis Date  . Arthritis   . Migraine headache   . Dyslipidemia   . Hypertension   . 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 Dysthymia   . Glucose intolerance (impaired glucose tolerance)   . 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 Renal insufficiency   . Anxiety     Past Surgical History  Procedure Laterality Date  . Elbow surgery Right   . Replacement total knee Bilateral 2002/2003  . Breast lumpectomy Right 2001    Current Outpatient Prescriptions  Medication Sig Dispense Refill  . amitriptyline (ELAVIL) 50 MG tablet Take 1 tablet (50 mg total) by mouth at bedtime. 90 tablet 3  . FLUoxetine (PROZAC) 40 MG capsule Take 1 capsule (40 mg total) by mouth daily. 90 capsule 3  . ibuprofen (ADVIL,MOTRIN) 200 MG tablet Take 400 mg by mouth daily.  May take an additional tablet prn    . nebivolol (BYSTOLIC) 5 MG tablet Take 1 tablet (5 mg total) by mouth daily. 90 tablet 3  . valsartan (DIOVAN) 160 MG tablet Take 1 tablet (160 mg total) by mouth daily. 90 tablet 3   No current facility-administered medications for this visit.    Allergies as of 05/15/2014 - Review Complete 05/15/2014  Allergen Reaction Noted  . Statins  04/29/2013    Vitals: BP 166/97 mmHg  Pulse 82  Resp 16  Ht 5' 5.75" (1.67 m)  Wt 213 lb 12.8 oz (96.979 kg)  BMI 34.77 kg/m2 Last Weight:  Wt Readings from Last 1 Encounters:  05/15/14 213 lb 12.8  oz (96.979 kg)   Last Height:   Ht Readings from Last 1 Encounters:  05/15/14 5' 5.75" (1.67 m)    General: The patient is awake, alert and appears not in acute distress. The patient is well groomed. Head: Normocephalic, atraumatic. Neck is supple. Mallampati 4 , neck circumference: 16  Cardiovascular:  Regular rate and rhythm , without  murmurs or carotid bruit, and without distended neck veins. Respiratory: Lungs are clear to auscultation. Skin:  Without evidence of  Rash, she has  Ankle edema. Puffy cheeks, dry mouth.  Trunk: BMI is elevated and patient  has normal posture.   Neurologic exam : The patient is awake and alert, oriented to place and time.  Memory subjective  described as impaired . There is a limited  attention span & concentration ability.  Speech is fluent withoutysarthria, dysphonia - but she is often looking for words.  aphasia. Mood and affect are appropriate. The patient had nocturnal hallucinations when she tried Zetia (?), she had seen her daughter in the bed room and a stranger .  Cranial nerves: Pupils are equal and briskly reactive to light. Funduscopic exam without  evidence of pallor or edema. Extraocular movements  in vertical and horizontal planes intact and without nystagmus. Visual fields by finger perimetry are intact. Hearing to finger rub intact.  Facial sensation intact  to fine touch. Facial motor strength is symmetric and tongue and uvula move midline.  Motor exam: Normal tone and normal muscle bulk and symmetric normal strength in all extremities. The patient has neither cogwheel rigidity nor spasticity. I do not see a resting tremor and I don't see any tremor with action no dysmetria nor ataxia.  Sensory:  Fine touch, pinprick and vibration were tested in all extremities. Proprioception is tested in the upper extremities only. This was  normal.  Coordination: Rapid alternating movements in the fingers/hands is tested and normal.  Finger-to-nose maneuver tested and normal without evidence of ataxia, dysmetria or tremor.  Gait and station: Patient walks without assistive device and is able and assisted stool climb up to the exam table.  Strength within normal limits. Stance is stable and normal. The patient has a normal arm swing when she walks, she could turn with 3-4 steps she did not appear to be drifting.  Her base of gait is normal and not extremely widened. Deep tendon reflexes: in the upper and lower extremities are symmetric and intact. Babinski maneuver response is  downgoing.   Assessment:  After physical and neurologic examination, review of laboratory studies, imaging, neurophysiology testing and pre-existing records, assessment :  The patient is the youngest of 59 children many of her siblings and her father have lived into the 61s and 47s. She does not have a family history of dementia. Her Montral cognitive assessment test today scored at 23 out of 30 points which is below the expected point rates. However it wasn't the short-term recall that was a difficulty for the patient it was more the complex tasks such as the trail making test copying a three-dimensional object or abstract obstruction. Was able to look to name 13 words beginning with the letter F.  Plan:  Treatment plan and additional workup :  I think the patient does have mild  cognitive impairment, but I am not sure if a concussion or contusion could have not provoked some of these. It seems to be that the patient recalled a stepwise decline beginning with what she thought may have been a mild stroke,  according to Dr Janann Colonel . An MRI could not confirm the stroke the ovary. The patient has fallen she has hit her head probably 2 times perhaps even more and I do think this could be a post concussion related event. I would like to make sure that the patient is not on any anticholinergic medicine, I would also suggest to place her on a low-dose of Aricept as an memory stabilizer. In addition I have still trouble to explain why the patient would fall she does not have focal weakness she has normal muscle tone she has the ability to flex and relax muscles.  And her falls are not always directed the same way. She can fall forward and backward or sometimes falls to the left this is usually not a cerebellar, vestibular balance organ dependent pattern of falls. I see no evidence for Lewy body dementia and I don't see evidence for Parkinson's disease.

## 2014-05-17 ENCOUNTER — Other Ambulatory Visit: Payer: Self-pay | Admitting: Family Medicine

## 2014-05-17 DIAGNOSIS — F411 Generalized anxiety disorder: Secondary | ICD-10-CM

## 2014-05-17 DIAGNOSIS — F341 Dysthymic disorder: Secondary | ICD-10-CM

## 2014-05-17 DIAGNOSIS — I10 Essential (primary) hypertension: Secondary | ICD-10-CM

## 2014-05-17 MED ORDER — NEBIVOLOL HCL 5 MG PO TABS: 5.0000 mg | ORAL_TABLET | Freq: Every day | ORAL | Status: DC

## 2014-05-17 MED ORDER — VALSARTAN 160 MG PO TABS: 160.0000 mg | ORAL_TABLET | Freq: Every day | ORAL | Status: DC

## 2014-05-17 MED ORDER — AMITRIPTYLINE HCL 50 MG PO TABS: 50.0000 mg | ORAL_TABLET | Freq: Every day | ORAL | Status: DC

## 2014-05-17 MED ORDER — FLUOXETINE HCL 40 MG PO CAPS: 40.0000 mg | ORAL_CAPSULE | Freq: Every day | ORAL | Status: DC

## 2014-05-20 ENCOUNTER — Ambulatory Visit (INDEPENDENT_AMBULATORY_CARE_PROVIDER_SITE_OTHER): Payer: PPO | Admitting: Neurology

## 2014-05-20 DIAGNOSIS — R296 Repeated falls: Secondary | ICD-10-CM | POA: Diagnosis not present

## 2014-05-20 DIAGNOSIS — G3184 Mild cognitive impairment, so stated: Secondary | ICD-10-CM

## 2014-05-20 DIAGNOSIS — S060X1S Concussion with loss of consciousness of 30 minutes or less, sequela: Secondary | ICD-10-CM | POA: Diagnosis not present

## 2014-05-27 ENCOUNTER — Telehealth: Payer: Self-pay | Admitting: Neurology

## 2014-05-27 MED ORDER — MEMANTINE HCL ER 7 & 14 & 21 &28 MG PO CP24: 7.0000 mg | ORAL_CAPSULE | Freq: Once | ORAL | Status: DC

## 2014-05-27 NOTE — Telephone Encounter (Signed)
Message forwarded at 16.40 - she can try using it in AM, usually aricept related nausea is better when taken after a meal. If she is that sensitive , we should change her to namenda and later add aricept again. We do EEG tests, results in EPIC.
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
## 2014-05-27 NOTE — Telephone Encounter (Signed)
Patient is calling to get results of her EEC test. Please call. Patient also states that her Rx Donepezil 5 mg that she takes at bedtime causes her not to be able to sleep and to throw up. There is also a question of when it should be taken.  Please call.

## 2014-05-28 MED ORDER — MEMANTINE HCL ER 7 & 14 & 21 &28 MG PO CP24: ORAL_CAPSULE | ORAL | Status: DC

## 2014-05-28 NOTE — Telephone Encounter (Signed)
EEG shows no seizure activity or significant finding

## 2014-05-28 NOTE — Addendum Note (Signed)
Addended byOliver Hum on: 05/28/2014 02:58 PM   Modules accepted: Orders

## 2014-05-28 NOTE — Telephone Encounter (Signed)
I spoke to pt and relayed that EEG showed no seizure activity or significant finding.  She took generic aricept after eating and was still having vomiting.  She will try namenda XR titration pack sample, placed up front LOT [id number] exp 07/2014.   She is very sensitive to [employer].  She will let us know.

## 2014-06-20 ENCOUNTER — Telehealth: Payer: Self-pay | Admitting: Family Medicine

## 2014-06-24 ENCOUNTER — Telehealth: Payer: Self-pay | Admitting: Neurology

## 2014-06-24 MED ORDER — MEMANTINE HCL ER 28 MG PO CP24: 28.0000 mg | ORAL_CAPSULE | Freq: Every day | ORAL | Status: DC

## 2014-06-24 NOTE — Telephone Encounter (Signed)
P.A. Amitriptyline approved til 02/21/15, Pt informed.

## 2014-06-24 NOTE — Telephone Encounter (Signed)
Patient called requesting a refill for Memantine HCl ER (NAMENDA XR TITRATION PACK) 7 & 14 & 21 &28 MG CP24. Pharmacy: [facility] on [address] # [phone] Patent can be reached @ [phone]

## 2014-06-24 NOTE — Telephone Encounter (Signed)
I called back.  Patient said she has tolerated the Titration kit well, and would like a Rx for the maint dose.  Rx has been sent.  Patient is aware.

## 2014-06-25 ENCOUNTER — Ambulatory Visit: Payer: PPO | Attending: Neurology

## 2014-06-25 DIAGNOSIS — R29898 Other symptoms and signs involving the musculoskeletal system: Secondary | ICD-10-CM

## 2014-06-25 DIAGNOSIS — R269 Unspecified abnormalities of gait and mobility: Secondary | ICD-10-CM | POA: Insufficient documentation

## 2014-06-25 DIAGNOSIS — R296 Repeated falls: Secondary | ICD-10-CM | POA: Diagnosis not present

## 2014-06-25 NOTE — Therapy (Signed)
[facility] [address] [place], [place], [address] Phone: [phone]   Fax:  [phone]  Physical Therapy Evaluation  Patient Details  Name: Stacey Wells MRN: [id number] Date of Birth: April 29, 1940 Referring Provider:  Larey Seat, MD  Encounter Date: 06/25/2014      PT End of Session - 06/25/14 1136    Visit Number 1   Number of Visits 17   Date for PT Re-Evaluation 08/24/14   Authorization Type G-code every 10th visit.   PT Start Time 1017   PT Stop Time 1105   PT Time Calculation (min) 48 min   Equipment Utilized During Treatment Gait belt   Activity Tolerance Patient tolerated treatment well   Behavior During Therapy WFL for tasks assessed/performed      Past Medical History  Diagnosis Date  . Arthritis   . Migraine headache   . Dyslipidemia   . Hypertension   . 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 Dysthymia   . Glucose intolerance (impaired glucose tolerance)   . 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 Renal insufficiency   . Anxiety     Past Surgical History  Procedure Laterality Date  . Elbow surgery Right   . Replacement total knee Bilateral 2002/2003  . Breast lumpectomy Right 2001    There were no vitals filed for this visit.  Visit Diagnosis:  Abnormality of gait - Plan: PT plan of care cert/re-cert  Frequent falls - Plan: PT plan of care cert/re-cert  Leg weakness, bilateral - Plan: PT plan of care cert/re-cert      Subjective Assessment - 06/25/14 1025    Subjective frequent falls, impaired balance, decreased strength   Pertinent History B TKA, mild memory impairments, hx of breast CA, HTN   Patient Stated Goals stop falling, improve balance, improve strength   Currently in Pain? No/denies            [facility] PT Assessment - 06/25/14 1034    Assessment   Medical Diagnosis Falls frequently, amnestic MCI, concussion with LOC (less than 30 minutes)   Onset Date  01/24/14   Precautions   Precautions Fall   Restrictions   Weight Bearing Restrictions No   Balance Screen   Has the patient fallen in the past 6 months Yes   How many times? 18   Has the patient had a decrease in activity level because of a fear of falling?  Yes   Is the patient reluctant to leave their home because of a fear of falling?  No   Home Environment   Living Enviornment Private residence   Living Arrangements Spouse/significant other   Available Help at Discharge Family   Type of [place] to enter   Entrance Stairs-Number of Steps 2  7 steps out back, building new entrance with even steps   Entrance Stairs-Rails Can reach both   Home Layout One level   [place] - single point   Additional Comments Medical alert necklace   Prior Function   Level of Independence Independent with basic ADLs;Independent with homemaking with ambulation;Independent with gait;Independent with transfers   Vocation Retired   Leisure garden and Therapist, occupational (uses spade to garden, to squat)   Cognition   Overall Cognitive Status Impaired/Different from baseline   Memory Appears intact  short term   Observation/Other Assessments   Focus on Therapeutic Outcomes (FOTO)  ABC: 26.9%  Sensation   Light Touch Appears Intact   Coordination   Gross Motor Movements are Fluid and Coordinated Yes   Fine Motor Movements are Fluid and Coordinated Yes   Posture/Postural Control   Posture/Postural Control Postural limitations   Postural Limitations Flexed trunk;Rounded Shoulders;Forward head   ROM / Strength   AROM / PROM / Strength AROM;Strength   AROM   Overall AROM  Within functional limits for tasks performed   Strength   Overall Strength Deficits   Overall Strength Comments B UE and L LE WFL. R LE: hip flex; 4/5, knee ext: 3+/5, knee flex: 4/5, ankle dorsiflexion: 4/5, hip abd: 3/5   Transfers   Transfers Sit to Stand;Stand to Sit   Sit to Stand 5:  Supervision;With upper extremity assist;From chair/3-in-1   Stand to Sit 5: Supervision;With upper extremity assist;To chair/3-in-1   Ambulation/Gait   Ambulation/Gait Yes   Ambulation/Gait Assistance 5: Supervision   Ambulation/Gait Assistance Details No overt LOB, but pt noted to experience increased postural sway during turns.   Ambulation Distance (Feet) 100 Feet   Assistive device None   Gait Pattern Step-through pattern;Decreased stride length;Decreased dorsiflexion - right;Trunk flexed  increased cadence   Ambulation Surface Level;Indoor   Gait velocity 2.81ft/sec.  no AD   Standardized Balance Assessment   Standardized Balance Assessment Berg Balance Test;Timed Up and Go Test   Berg Balance Test   Sit to Stand Able to stand without using hands and stabilize independently   Standing Unsupported Able to stand safely 2 minutes   Sitting with Back Unsupported but Feet Supported on Floor or Stool Able to sit safely and securely 2 minutes   Stand to Sit Sits safely with minimal use of hands   Transfers Able to transfer safely, minor use of hands   Standing Unsupported with Eyes Closed Able to stand 10 seconds with supervision   Standing Ubsupported with Feet Together Able to place feet together independently and stand 1 minute safely   From Standing, Reach Forward with Outstretched Arm Can reach forward >12 cm safely (5")   From Standing Position, Pick up Object from Floor Able to pick up shoe, needs supervision   From Standing Position, Turn to Look Behind Over each Shoulder Looks behind from both sides and weight shifts well   Turn 360 Degrees Needs close supervision or verbal cueing   Standing Unsupported, Alternately Place Feet on Step/Stool Able to complete 4 steps without aid or supervision   Standing Unsupported, One Foot in Front Able to plae foot ahead of the other independently and hold 30 seconds  R foot in back: 10 sec., L foot in back: 30 sec.   Standing on One Leg Tries  to lift leg/unable to hold 3 seconds but remains standing independently   Total Score 44   Timed Up and Go Test   TUG Normal TUG   Normal TUG (seconds) 7.56  no AD                           PT Education - 06/25/14 1135    Education provided Yes   Education Details PT duration/frequency. Discussed BERG, gait speed, and TUG time results.   Person(s) Educated Patient   Methods Explanation   Comprehension Verbalized understanding          PT Short Term Goals - 06/25/14 1139    PT SHORT TERM GOAL #1   Title Pt will be independent in HEP to improve  balance, strength, endurance, and flexibility. Target date: 07/23/14.   Status New   PT SHORT TERM GOAL #2   Title Pt will report zero falls in the last 4 weeks to increase safety during functional moblilty. Target date: 07/23/14.   Status New           PT Long Term Goals - 07/02/2014 1140    PT LONG TERM GOAL #1   Title Pt will verbalize understanding of fall prevention strategies to decrease risk of falls. Target date: 08/20/14.   Status New   PT LONG TERM GOAL #2   Title Pt will improve BERG score to >/=52/56 to reduce falls risk. Target date: 08/20/14.   Status New   PT LONG TERM GOAL #3   Title Pt will ambulate 1000' over even/uneven terrain, with LRAD, to improve functional mobility. Target date: 08/20/14.   Status New   PT LONG TERM GOAL #4   Title Write DGI goal if appropriate. Target date: 08/20/14.   Status New   PT LONG TERM GOAL #5   Title Pt's ABC score will improve by 10% to improve quality of life. Target date: 08/20/14.   Status New               Plan - Jul 02, 2014 1031    Clinical Impression Statement Pt is a pleasant 74y/o female presenting to OPPT neuro with frequent falls, impaired balance, decreased hamstring flexibility, decreased strength and endurance. Pt reported her balance issues began after B TKA surgery, and has become progressively worse over the last few months. Pt has fallen 18  times in the last 6 months.  Falls occur while walking and performing sit to stands. Pt reported she has had several concussions due ot falls.  Pt has history of mild cognitive impairment according to MD notes and pt. Pt is currenlty taking medication to assist memory. Pt's BERG score indicates pt is at a significant risk for falls.  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    Pt will benefit from skilled therapeutic intervention in order to improve on the following deficits Abnormal gait;Impaired flexibility;Decreased endurance;Decreased knowledge of use of DME;Decreased balance;Decreased mobility;Decreased strength   Rehab Potential Good   PT Frequency 2x / week   PT Duration 8 weeks   PT Treatment/Interventions ADLs/Self Care Home Management;Gait training;Neuromuscular re-education;Stair training;Biofeedback;Functional mobility training;Patient/family education;Therapeutic activities;DME Instruction;Balance training;Electrical Stimulation;Therapeutic exercise;Manual techniques   PT Next Visit Plan Perform DGI and write goal if applicable. Initiate strength, flexibility, and strenthening HEP.   Consulted and Agree with Plan of Care Patient          G-Codes - 07-02-14 1144    Functional Assessment Tool Used BERG: 44/56; gait speed no AD: 2.28ft/sec.; TUG no AD: 7.56sec.   Functional Limitation Mobility: Walking and moving around   Mobility: Walking and Moving Around Current Status [phone]) At least 40 percent but less than 60 percent impaired, limited or restricted   Mobility: Walking and Moving Around Goal Status [phone]) At least 1 percent but less than 20 percent impaired, limited or restricted       Problem List Patient Active Problem List   Diagnosis Date Noted  . Falls frequently 05/15/2014  . Hearing abnormally acute 05/15/2014  . Amnestic MCI (mild cognitive impairment with memory loss) 05/15/2014  . Concussion with loss of  consciousness 05/15/2014  . Breast cancer 05/15/2014  . Gait instability 07/01/2013  . Statin intolerance 02/07/2013  . Dysthymia 02/09/2011  .  Hyperlipidemia with target LDL less than 100 02/09/2011  . Hypertension 02/09/2011    Camara Renstrom L 06/25/2014, 11:45 AM  [facility] [address] [place], [place], [address] Phone: [phone]   Fax:  [phone]    Geoffry Paradise, PT,DPT 06/25/2014 11:45 AM Phone: [phone] Fax: [phone]

## 2014-07-01 ENCOUNTER — Ambulatory Visit: Payer: PPO | Admitting: Physical Therapy

## 2014-07-02 ENCOUNTER — Encounter: Payer: PPO | Admitting: Physical Therapy

## 2014-07-02 DIAGNOSIS — R269 Unspecified abnormalities of gait and mobility: Secondary | ICD-10-CM | POA: Diagnosis not present

## 2014-07-02 NOTE — Therapy (Signed)
[facility] [address] [place], [place], [address] Phone: [phone]   Fax:  [phone]  Patient Details  Name: Stacey Wells MRN: [id number] Date of Birth: January 19, 1941 Referring Provider:  No ref. provider found  Encounter Date: 07/02/2014  PHYSICAL THERAPY DISCHARGE SUMMARY  Visits from Start of Care: 1  Current functional level related to goals / functional outcomes:     PT Short Term Goals - 06/25/14 1139    PT SHORT TERM GOAL #1   Title Pt will be independent in HEP to improve balance, strength, endurance, and flexibility. Target date: 07/23/14.   Status New   PT SHORT TERM GOAL #2   Title Pt will report zero falls in the last 4 weeks to increase safety during functional moblilty. Target date: 07/23/14.   Status New         PT Long Term Goals - 06/25/14 1140    PT LONG TERM GOAL #1   Title Pt will verbalize understanding of fall prevention strategies to decrease risk of falls. Target date: 08/20/14.   Status New   PT LONG TERM GOAL #2   Title Pt will improve BERG score to >/=52/56 to reduce falls risk. Target date: 08/20/14.   Status New   PT LONG TERM GOAL #3   Title Pt will ambulate 1000' over even/uneven terrain, with LRAD, to improve functional mobility. Target date: 08/20/14.   Status New   PT LONG TERM GOAL #4   Title Write DGI goal if appropriate. Target date: 08/20/14.   Status New   PT LONG TERM GOAL #5   Title Pt's ABC score will improve by 10% to improve quality of life. Target date: 08/20/14.   Status New        Remaining deficits: Unknown, as pt did not return since initial eval. Pt called and cancelled remaining appointments, stating "I can do the exercises at home that you gave me". However, pt did not receive any exercises at PT eval.   Education / Equipment: PT frequency/duration and the importance of improving strength, balance, and endurance to reduce falls risk.  Plan: Patient  agrees to discharge.  Patient goals were not met. Patient is being discharged due to the patient's request.  ?????       Monna Crean L 07/02/2014, 9:04 AM  [facility] [address] [place], [place], [address] Phone: [phone]   Fax:  [phone]

## 2014-07-24 ENCOUNTER — Encounter: Payer: PPO | Admitting: Physical Therapy

## 2014-08-11 ENCOUNTER — Encounter: Payer: Self-pay | Admitting: Nurse Practitioner

## 2014-08-11 ENCOUNTER — Ambulatory Visit (INDEPENDENT_AMBULATORY_CARE_PROVIDER_SITE_OTHER): Payer: PPO | Admitting: Nurse Practitioner

## 2014-08-11 VITALS — BP 138/84 | HR 89 | Ht 65.5 in | Wt 214.8 lb

## 2014-08-11 DIAGNOSIS — R2681 Unsteadiness on feet: Secondary | ICD-10-CM | POA: Diagnosis not present

## 2014-08-11 DIAGNOSIS — G3184 Mild cognitive impairment, so stated: Secondary | ICD-10-CM | POA: Diagnosis not present

## 2014-08-11 NOTE — Progress Notes (Signed)
I agree with the assessment and plan as directed by NP .The patient is known to me .   Samya Siciliano, MD  

## 2014-08-11 NOTE — Progress Notes (Signed)
[facility]  PATIENT: Stacey Wells DOB: [birth year]   REASON FOR VISIT: Follow-up for memory loss and frequent falls  HISTORY FROM: Patient and husband    HISTORY OF PRESENT ILLNESS: [name], 74 year old female returns for follow-up. She was last seen by Dr. Brett Fairy on 05/15/2014 and previously by Dr. Janann Colonel who has left the practice. She was placed on Aricept after her last visit but had vomiting from the medication. She was then placed on Namenda extended release and titrated slightly. The patient and her husband feel [name] has been beneficial and she feels better. She did have a PT evaluation but the patient does not feel like physical therapy was beneficial for her. She has had a few falls since last seen, no injury. She continues to do her household duties without difficulty, cooking cleaning etc. She also works in the garden. She returns for reevaluation   HISTORY: Mrs. Stacey Wells , a married , right handed caucasian patient from [place] reports today that she has continued to fall frequently. Apparently she was seen by Dr. Janann Colonel for the same concern in addition at his visit with him she had mentioned a cognitive concern. After being evaluated by the movement disorder specialist, she was diagnosed with Parkinson's syndrome, which terrifies her, of course.  She was also sent for an MRI of the brain which returned normal for age with some chronic microvascular ischemia that was only mild. The patient has an interesting story to her frequent falls she seems sometimes to have a warning that she knows she may follow but some of her falls occur out of the blue and very sudden. She has suffered injuries and she is visibly bruised for example on the lateral epicondyle of the right elbow, she describes large hematomata on her buttocks and on the abdomen left more than right. The vertebral fracture in the past. She has only once hit her head during a fall but at that time  hit the chest of drawers with a hard surface. One time she fell outside and hit the back of her head on the grass, but she had trouble to get back up. The falls appear independent of time I'm of day, or surrounding, of proximity to mealtimes, of fluid intake, this seems to be no special activity that may trigger it.  They can occur as the patient stands or sits or walks. Independent of the falls the patient has had some episodes of sudden weakness, one of them she describes as feeling a wave of heat coming over her she was extremely hot. She describes that she slid from a chair onto the bed and then couldn't find the strength to get up again and she was confused about her surroundings. It may have taken her several minutes to reorient herself to the surroundings. She has also started to use night lights as she feels more disoriented in the dark. She was referred for cardiac monitoring and this resulted in normal tracing.   She had a normal EEG at this office.  She reports ongoing confusion, getting lost in a familiar environment and gets parts of conversations. Her husband has noted that there is a cognitive problem and he has mentioned is here today. My nurse Lovey Newcomer performed a [place] cognitive assessment test today with the patient. He was able to name 3 animals, she was perfectly fine in drawing a clock face and placing the hands of the clock at 11:10. She had trouble copying a every image and the  Trail Making Test gave her difficulties. Her attention was excellent and she was able to repeat a list of digits without problem. She performed 2 out of 5 subtractions correctly, but she did not word for word repeat the sentences given to her.  MOCA was was tested and the patient was able minutes several minutes after the initial test was performed to recall 4 out of 5 words. She appears anxious, afraid to fail.    Note Jim Like, DO note below May 2015   In the past month notes some severe  imbalance, she reports having multiple falls, notes that it is getting progressively worse. Notes that she will trip over small objects and will also just fall over, typically notes she will fall forwards. Notes she veers to the left often. Denies any light headed sensation, no vertigo. Notes some subjective weakness in her legs, notes cramping of her muscles. Denies any generalized bradykinesia. Notes difficulty holding onto objects. Notes a mild rest tremor in bilateral hands. Notes some shrinking of her handwriting. No known REM behavior disorder. Notes some difficulty with short term memory, has trouble recalling conversations and things that just happened. Notes she gets lost frequently when driving.   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 No known strokes or TIAs. Per patient she has a lumbar fracture, she is followed by ortho for this and is wearing a brace. She is taking tramadol as needed for pain.    REVIEW OF SYSTEMS: Full 14 system review of systems performed and notable only for those listed, all others are neg:  Constitutional: neg  Cardiovascular: neg Ear/Nose/Throat: neg  Skin: neg Eyes: neg Respiratory: neg Gastroitestinal: neg  Hematology/Lymphatic: neg  Endocrine: neg Musculoskeletal:neg Allergy/Immunology: neg Neurological: neg Psychiatric: neg Sleep : neg   ALLERGIES: Allergies  Allergen Reactions  . Aricept [Donepezil Hcl] Nausea And Vomiting    vomiting  . Prozac [Fluoxetine Hcl] Other (See Comments)    hallucinations  . Statins     Dizziness and lightheadedness.     HOME MEDICATIONS: Outpatient Prescriptions Prior to Visit  Medication Sig Dispense Refill  . amitriptyline (ELAVIL) 50 MG tablet Take 1 tablet (50 mg total) by mouth at bedtime. 90 tablet 3  . ibuprofen (ADVIL,MOTRIN) 200 MG tablet Take 400 mg by mouth daily. May take an additional tablet prn    . memantine (NAMENDA XR) 28 MG CP24 24 hr capsule Take 1 capsule (28 mg total)  by mouth daily. 30 capsule 6  . nebivolol (BYSTOLIC) 5 MG tablet Take 1 tablet (5 mg total) by mouth daily. 90 tablet 3  . valsartan (DIOVAN) 160 MG tablet Take 1 tablet (160 mg total) by mouth daily. 90 tablet 3  . donepezil (ARICEPT) 10 MG tablet Take 1 tablet (10 mg total) by mouth at bedtime. (Patient not taking: Reported on 06/25/2014) 90 tablet 3  . donepezil (ARICEPT) 5 MG tablet Take 1 tablet (5 mg total) by mouth at bedtime. (Patient not taking: Reported on 06/25/2014) 30 tablet 0  . FLUoxetine (PROZAC) 40 MG capsule Take 1 capsule (40 mg total) by mouth daily. (Patient not taking: Reported on 08/11/2014) 90 capsule 3   No facility-administered medications prior to visit.    PAST MEDICAL HISTORY: Past Medical History  Diagnosis Date  . Arthritis   . Migraine headache   . Dyslipidemia   . Hypertension   . 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 Dysthymia   . Glucose intolerance (impaired glucose tolerance)   .  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 Renal insufficiency   . Anxiety     PAST SURGICAL HISTORY: Past Surgical History  Procedure Laterality Date  . Elbow surgery Right   . Replacement total knee Bilateral 2002/2003  . Breast lumpectomy Right 2001    FAMILY HISTORY: History reviewed. No pertinent family history.  SOCIAL HISTORY: History   Social History  . Marital Status: Married    Spouse Name: N/A  . Number of Children: N/A  . Years of Education: N/A   Occupational History  . Not on file.   Social History Main Topics  . Smoking status: Never Smoker   . Smokeless tobacco: Not on file  . Alcohol Use: 0.0 oz/week    0 Standard drinks or equivalent per week     Comment: occa  . Drug Use: No  . Sexual Activity: Not Currently   Other Topics Concern  . Not on file   Social History Narrative   Married, 4 children   Right handed   12 th grade   1 cup daily     PHYSICAL EXAM  Filed Vitals:   08/11/14 0850  BP: 138/84  Pulse: 89  Height:  5' 5.5" (1.664 m)  Weight: 214 lb 12.8 oz (97.433 kg)   Body mass index is 35.19 kg/(m^2). General: The patient is awake, alert and appears not in acute distress. The patient is well groomed. Head: Normocephalic, atraumatic.  Neck is supple. No bruit Cardiovascular: Regular rate and rhythm , without murmurs  Respiratory: Lungs are clear to auscultation. Skin: Without evidence of Rash,  Neurologic exam :The patient is awake and alert, oriented to place and time. [place] 25/30 last 23/30. AFT 11. Speech is fluent without, dysphonia. Mood and affect are appropriate. Cranial nerves:Pupils are equal and briskly reactive to light. Funduscopic exam without evidence of pallor or edema. Extraocular movements in vertical and horizontal planes intact and without nystagmus. Visual fields by finger perimetry are intact.Hearing to finger rub intact. Facial sensation intact to fine touch. Facial motor strength is symmetric and tongue and uvula move midline. Motor exam: Normal tone and normal muscle bulk and symmetric normal strength in all extremities No  cogwheel rigidity nor spasticity. No resting tremor . Sensory: Fine touch, pinprick and vibration were tested in all extremities.  Coordination: Rapid alternating movements in the fingers/hands is tested and normal.Finger-to-nose maneuver tested and normal without evidence of ataxia, dysmetria or tremor. Gait and station: Patient walks without assistive device and is able to  stool climb up to the exam table.  Strength within normal limits. Stance is stable and normal. The patient has a normal arm swing when she walks, she could turn with 3-4 steps she did not appear to be drifting.  Her base of gait is normal and not extremely widened.  Deep tendon reflexes: in the upper and lower extremities are symmetric and intact. Babinski maneuver response is downgoing.   DIAGNOSTIC DATA (LABS, IMAGING, TESTING) - I reviewed patient records, labs, notes,  testing and imaging myself where available.  Lab Results  Component Value Date   WBC 5.0 04/14/2014   HGB 13.8 04/14/2014   HCT 41.4 04/14/2014   MCV 90.6 04/14/2014   PLT 197 04/14/2014      Component Value Date/Time   NA 137 04/14/2014 1206   K 4.4 04/14/2014 1206   CL 100 04/14/2014 1206   CO2 26 04/14/2014 1206   GLUCOSE 91 04/14/2014 1206   BUN  27* 04/14/2014 1206   CREATININE 1.19* 04/14/2014 1206   CREATININE 1.44* 07/30/2009 1357   CALCIUM 9.7 04/14/2014 1206   PROT 7.0 04/14/2014 1206   PROT 6.9 07/01/2013 1404   ALBUMIN 3.9 04/14/2014 1206   AST 24 04/14/2014 1206   ALT 19 04/14/2014 1206   ALKPHOS 75 04/14/2014 1206   BILITOT 0.6 04/14/2014 1206   Lab Results  Component Value Date   CHOL 330* 04/14/2014   HDL 69 04/14/2014   LDLCALC 233* 04/14/2014   TRIG 140 04/14/2014   CHOLHDL 4.8 04/14/2014       ASSESSMENT AND PLAN  74 y.o. year old female  has a past medical history of mild cognitive impairment with improvement in her MOCA score since being placed on Namenda extended release. She vomited on Aricept. She did have physical therapy evaluation for her frequent falls however she canceled the rest of the appointments. She has had a couple of falls since last seen no apparent injury, no assistive device.  Memory score is stable Continue Namenda 28 mg extended release daily does not need refills Follow-up in 6 months Dennie Bible, [facility], [facility], APRN  [facility] [address], [place], [place] [address] [phone]

## 2014-08-11 NOTE — Patient Instructions (Signed)
Memory score is stable Continue Namenda 28 mg extended release daily does not need refills Follow-up in 6 months

## 2014-10-07 ENCOUNTER — Telehealth: Payer: Self-pay | Admitting: Family Medicine

## 2014-10-07 NOTE — Telephone Encounter (Signed)
Pt called and was wanting to change her blood pressure med, she took her self off 3 days ago said it caused her a lot of coughing and hasn't coughed since she quit taking it, i made her a appt for august the 22nd and but she didn't know if she could go that long without her BP med, please advise if you want me to squeeze her in anywhere sooner, thanks :):):),

## 2014-10-07 NOTE — Telephone Encounter (Signed)
It's okay to wait till the 22nd

## 2014-10-07 NOTE — Telephone Encounter (Signed)
Pt informed and verbalized understanding

## 2014-10-13 ENCOUNTER — Ambulatory Visit (INDEPENDENT_AMBULATORY_CARE_PROVIDER_SITE_OTHER): Payer: PPO | Admitting: Family Medicine

## 2014-10-13 ENCOUNTER — Encounter: Payer: Self-pay | Admitting: Family Medicine

## 2014-10-13 VITALS — BP 120/80 | HR 90 | Ht 66.0 in | Wt 219.0 lb

## 2014-10-13 DIAGNOSIS — R05 Cough: Secondary | ICD-10-CM

## 2014-10-13 DIAGNOSIS — R296 Repeated falls: Secondary | ICD-10-CM

## 2014-10-13 DIAGNOSIS — R2681 Unsteadiness on feet: Secondary | ICD-10-CM

## 2014-10-13 DIAGNOSIS — I10 Essential (primary) hypertension: Secondary | ICD-10-CM | POA: Diagnosis not present

## 2014-10-13 DIAGNOSIS — F341 Dysthymic disorder: Secondary | ICD-10-CM | POA: Diagnosis not present

## 2014-10-13 DIAGNOSIS — T464X5A Adverse effect of angiotensin-converting-enzyme inhibitors, initial encounter: Secondary | ICD-10-CM

## 2014-10-13 DIAGNOSIS — R058 Other specified cough: Secondary | ICD-10-CM

## 2014-10-13 NOTE — Progress Notes (Signed)
   Subjective:    Patient ID: Stacey Wells, female    DOB: 1941-01-03, 74 y.o.   MRN: [id number]  HPI She is here for consultation concerning multiple issues. She thinks that her valsartan has cause difficulty with coughing. She did stop the medication and the cough went away. She then started again and the cough came back and therefore she subsequently stopped taking the medication. She has a previous history of difficulty with ACE cough.She is being followed by Dr. Brett Fairy and is now on Namenda. She notes an improvement in her cognitive ability. He was also recommended she get involved in physical therapy however she did go once and decided not to return.She also notes difficulty with sleeping. She does not think that the amitriptyline is working anymore. Also she has had difficulty recently with falls. Further history indicates she tends to get slightly dizzy when she goes from sitting to standing but has not noted similar type of a problem when she is in bed and dissected attempts to get up.She did fall on one occasion but blames it on missing a step.She also has stopped taking her Prozac. She states that she is doing quite well off this medication and does not want start back on it.   Review of Systems     Objective:   Physical Exam Alert and in no distress. There is definitely an improvement in her cognitive function all talking to her. She is quite animated. Tympanic membranes and canals are normal. Pharyngeal area is normal. Neck is supple without adenopathy or thyromegaly. Cardiac exam shows a regular sinus rhythm without murmurs or gallops. Lungs are clear to auscultation.        Assessment & Plan:  Dysthymia  Essential hypertension  Falls frequently  Gait instability  ACE-inhibitor cough She seems to be doing quite well on the Namenda and I will therefore recommend she continue on that. She will check her blood pressure at home. She does have a machine that is fairly  accurate. If her present medication is not working well, I will readdress this issue. Also discussed the fact that she needs to get up slowly moving from one position to another as her symptoms do sound postural in nature. We will readdress the sleep issue at another point in time. On encouraged her to remain physically active. Over 25 minutes, greater than 50% spent in counseling and coordination of care.

## 2014-10-13 NOTE — Patient Instructions (Addendum)
Check your blood pressure weekly for the next month or 2 and as long as it stays under 140/90 I will leave you alone. He goes above that then let me know When you go from a whining to sitting to standing you need to do it slowly

## 2014-10-15 ENCOUNTER — Encounter: Payer: Self-pay | Admitting: Cardiovascular Disease

## 2014-10-23 DIAGNOSIS — R55 Syncope and collapse: Secondary | ICD-10-CM

## 2014-10-23 HISTORY — DX: Syncope and collapse: R55

## 2014-11-06 ENCOUNTER — Encounter (HOSPITAL_BASED_OUTPATIENT_CLINIC_OR_DEPARTMENT_OTHER): Payer: Self-pay | Admitting: *Deleted

## 2014-11-06 ENCOUNTER — Other Ambulatory Visit: Payer: Self-pay | Admitting: Orthopedic Surgery

## 2014-11-07 ENCOUNTER — Encounter (HOSPITAL_BASED_OUTPATIENT_CLINIC_OR_DEPARTMENT_OTHER): Payer: Self-pay | Admitting: Certified Registered"

## 2014-11-07 ENCOUNTER — Ambulatory Visit (HOSPITAL_BASED_OUTPATIENT_CLINIC_OR_DEPARTMENT_OTHER): Admission: RE | Admit: 2014-11-07 | Payer: PPO | Source: Ambulatory Visit | Admitting: Orthopedic Surgery

## 2014-11-07 HISTORY — DX: Syncope and collapse: R55

## 2014-11-07 HISTORY — DX: Other specified postprocedural states: R11.2

## 2014-11-07 HISTORY — DX: Repeated falls: R29.6

## 2014-11-07 HISTORY — DX: Other specified postprocedural states: Z98.890

## 2014-11-07 HISTORY — DX: Cerebral infarction, unspecified: I63.9

## 2014-11-07 HISTORY — DX: Unspecified dementia, unspecified severity, without behavioral disturbance, psychotic disturbance, mood disturbance, and anxiety: F03.90

## 2014-11-07 SURGERY — RELEASE, A1 PULLEY, FOR TRIGGER FINGER
Anesthesia: Choice | Site: Finger | Laterality: Right

## 2014-11-17 ENCOUNTER — Encounter: Payer: Self-pay | Admitting: Family Medicine

## 2014-11-17 ENCOUNTER — Ambulatory Visit (INDEPENDENT_AMBULATORY_CARE_PROVIDER_SITE_OTHER): Payer: PPO | Admitting: Family Medicine

## 2014-11-17 VITALS — BP 110/74 | HR 100 | Ht 67.0 in | Wt 218.0 lb

## 2014-11-17 DIAGNOSIS — Z23 Encounter for immunization: Secondary | ICD-10-CM | POA: Diagnosis not present

## 2014-11-17 DIAGNOSIS — I10 Essential (primary) hypertension: Secondary | ICD-10-CM | POA: Diagnosis not present

## 2014-11-17 DIAGNOSIS — F341 Dysthymic disorder: Secondary | ICD-10-CM | POA: Diagnosis not present

## 2014-11-17 DIAGNOSIS — G3184 Mild cognitive impairment, so stated: Secondary | ICD-10-CM

## 2014-11-17 NOTE — Progress Notes (Signed)
   Subjective:    Patient ID: Stacey Wells, female    DOB: [birth year], 74 y.o.   MRN: [id number]  HPI She is here for a recheck. She did bring her blood pressure cuff in. It does register much higher than ours however her readings at home look quite good. She has stopped taking her amitriptyline as well as Prozac and states that she does feel better. She states that Advil is helping with her sleep. She'll take 1 at night and sometimes 2 in the morning. The ones in the morning or more for arthritis symptoms. She continues on Namenda and states that she can deftly tell no improvement in her mental capacities. She is very happy with this.   Review of Systems     Objective:   Physical Exam Alert and in no distress otherwise not examined       Assessment & Plan:  Need for prophylactic vaccination against Streptococcus pneumoniae (pneumococcus) - Plan: Pneumococcal conjugate vaccine 13-valent  Need for prophylactic vaccination and inoculation against influenza - Plan: Flu vaccine HIGH DOSE PF (Fluzone High dose)  Dysthymia  Essential hypertension  MCI (mild cognitive impairment) with memory loss her immunizations were updated. Since she is doing well off the amitriptyline and Prozac, I will continue to watch her. She will continue on her Namenda. Immunizations were updated.

## 2014-11-21 ENCOUNTER — Other Ambulatory Visit: Payer: Self-pay | Admitting: Orthopedic Surgery

## 2014-12-15 ENCOUNTER — Encounter (HOSPITAL_BASED_OUTPATIENT_CLINIC_OR_DEPARTMENT_OTHER): Payer: Self-pay | Admitting: *Deleted

## 2014-12-19 ENCOUNTER — Ambulatory Visit (HOSPITAL_BASED_OUTPATIENT_CLINIC_OR_DEPARTMENT_OTHER)
Admission: RE | Admit: 2014-12-19 | Discharge: 2014-12-19 | Disposition: A | Discharge status: Home / Self Care | Payer: PPO | Source: Ambulatory Visit | Attending: Orthopedic Surgery | Admitting: Orthopedic Surgery

## 2014-12-19 ENCOUNTER — Ambulatory Visit (HOSPITAL_BASED_OUTPATIENT_CLINIC_OR_DEPARTMENT_OTHER): Payer: PPO | Admitting: Certified Registered"

## 2014-12-19 ENCOUNTER — Encounter (HOSPITAL_BASED_OUTPATIENT_CLINIC_OR_DEPARTMENT_OTHER): Payer: Self-pay

## 2014-12-19 ENCOUNTER — Encounter (HOSPITAL_BASED_OUTPATIENT_CLINIC_OR_DEPARTMENT_OTHER)
Admission: RE | Disposition: A | Discharge status: Home / Self Care | Payer: Self-pay | Source: Ambulatory Visit | Attending: Orthopedic Surgery

## 2014-12-19 DIAGNOSIS — Z8673 Personal history of transient ischemic attack (TIA), and cerebral infarction without residual deficits: Secondary | ICD-10-CM | POA: Diagnosis not present

## 2014-12-19 DIAGNOSIS — I10 Essential (primary) hypertension: Secondary | ICD-10-CM | POA: Insufficient documentation

## 2014-12-19 DIAGNOSIS — E785 Hyperlipidemia, unspecified: Secondary | ICD-10-CM | POA: Diagnosis not present

## 2014-12-19 DIAGNOSIS — F329 Major depressive disorder, single episode, unspecified: Secondary | ICD-10-CM | POA: Insufficient documentation

## 2014-12-19 DIAGNOSIS — F419 Anxiety disorder, unspecified: Secondary | ICD-10-CM | POA: Diagnosis not present

## 2014-12-19 DIAGNOSIS — Z791 Long term (current) use of non-steroidal anti-inflammatories (NSAID): Secondary | ICD-10-CM | POA: Insufficient documentation

## 2014-12-19 DIAGNOSIS — F0390 Unspecified dementia without behavioral disturbance: Secondary | ICD-10-CM | POA: Diagnosis not present

## 2014-12-19 DIAGNOSIS — M65341 Trigger finger, right ring finger: Secondary | ICD-10-CM | POA: Diagnosis present

## 2014-12-19 DIAGNOSIS — M1990 Unspecified osteoarthritis, unspecified site: Secondary | ICD-10-CM | POA: Diagnosis not present

## 2014-12-19 DIAGNOSIS — Z79899 Other long term (current) drug therapy: Secondary | ICD-10-CM | POA: Insufficient documentation

## 2014-12-19 HISTORY — DX: Trigger finger, right ring finger: M65.341

## 2014-12-19 HISTORY — PX: TRIGGER FINGER RELEASE: SHX641

## 2014-12-19 SURGERY — RELEASE, A1 PULLEY, FOR TRIGGER FINGER
Anesthesia: Monitor Anesthesia Care | Site: Hand | Laterality: Right

## 2014-12-19 MED ORDER — ONDANSETRON HCL 4 MG PO TABS: 4.0000 mg | ORAL_TABLET | Freq: Three times a day (TID) | ORAL | Status: DC | PRN

## 2014-12-19 MED ORDER — SUCCINYLCHOLINE CHLORIDE 20 MG/ML IJ SOLN
INTRAMUSCULAR | Status: AC
  Filled 2014-12-19: qty 1

## 2014-12-19 MED ORDER — LIDOCAINE HCL 1 % IJ SOLN
INTRAMUSCULAR | Status: DC | PRN
  Administered 2014-12-19: 5 mL

## 2014-12-19 MED ORDER — LIDOCAINE HCL (CARDIAC) 20 MG/ML IV SOLN
INTRAVENOUS | Status: DC | PRN
  Administered 2014-12-19: 75 mg via INTRAVENOUS

## 2014-12-19 MED ORDER — MEPERIDINE HCL 25 MG/ML IJ SOLN: 6.2500 mg | INTRAMUSCULAR | Status: DC | PRN

## 2014-12-19 MED ORDER — SCOPOLAMINE 1 MG/3DAYS TD PT72: 1.0000 | MEDICATED_PATCH | Freq: Once | TRANSDERMAL | Status: DC | PRN

## 2014-12-19 MED ORDER — PROPOFOL 10 MG/ML IV BOLUS
INTRAVENOUS | Status: DC | PRN
  Administered 2014-12-19 (×4): 20 mg via INTRAVENOUS

## 2014-12-19 MED ORDER — MIDAZOLAM HCL 2 MG/2ML IJ SOLN: 1.0000 mg | INTRAMUSCULAR | Status: DC | PRN

## 2014-12-19 MED ORDER — GLYCOPYRROLATE 0.2 MG/ML IJ SOLN
0.2000 mg | Freq: Once | INTRAMUSCULAR | Status: DC | PRN
Start: 2014-12-19 — End: 2014-12-19

## 2014-12-19 MED ORDER — FENTANYL CITRATE (PF) 100 MCG/2ML IJ SOLN
INTRAMUSCULAR | Status: AC
  Filled 2014-12-19: qty 4

## 2014-12-19 MED ORDER — LIDOCAINE HCL (CARDIAC) 20 MG/ML IV SOLN
INTRAVENOUS | Status: AC
  Filled 2014-12-19: qty 5

## 2014-12-19 MED ORDER — BUPIVACAINE HCL (PF) 0.5 % IJ SOLN
INTRAMUSCULAR | Status: DC | PRN
  Administered 2014-12-19: 5 mL

## 2014-12-19 MED ORDER — BUPIVACAINE HCL (PF) 0.25 % IJ SOLN
INTRAMUSCULAR | Status: AC
  Filled 2014-12-19: qty 30

## 2014-12-19 MED ORDER — LIDOCAINE HCL (PF) 1 % IJ SOLN
INTRAMUSCULAR | Status: AC
  Filled 2014-12-19: qty 30

## 2014-12-19 MED ORDER — BUPIVACAINE HCL (PF) 0.5 % IJ SOLN
INTRAMUSCULAR | Status: AC
  Filled 2014-12-19: qty 30

## 2014-12-19 MED ORDER — PROPOFOL 500 MG/50ML IV EMUL
INTRAVENOUS | Status: AC
  Filled 2014-12-19: qty 50

## 2014-12-19 MED ORDER — LACTATED RINGERS IV SOLN
INTRAVENOUS | Status: DC
  Administered 2014-12-19: 09:00:00 via INTRAVENOUS

## 2014-12-19 MED ORDER — HYDROCODONE-ACETAMINOPHEN 5-325 MG PO TABS: 1.0000 | ORAL_TABLET | Freq: Four times a day (QID) | ORAL | Status: DC | PRN

## 2014-12-19 MED ORDER — MIDAZOLAM HCL 2 MG/2ML IJ SOLN
INTRAMUSCULAR | Status: AC
  Filled 2014-12-19: qty 4

## 2014-12-19 MED ORDER — CEFAZOLIN SODIUM-DEXTROSE 2-3 GM-% IV SOLR
2.0000 g | INTRAVENOUS | Status: AC
  Administered 2014-12-19: 2 g via INTRAVENOUS

## 2014-12-19 MED ORDER — HYDROMORPHONE HCL 1 MG/ML IJ SOLN
0.2500 mg | INTRAMUSCULAR | Status: DC | PRN
  Administered 2014-12-19 (×3): 0.25 mg via INTRAVENOUS

## 2014-12-19 MED ORDER — ONDANSETRON HCL 4 MG/2ML IJ SOLN: 4.0000 mg | Freq: Once | INTRAMUSCULAR | Status: DC | PRN

## 2014-12-19 MED ORDER — FENTANYL CITRATE (PF) 100 MCG/2ML IJ SOLN
50.0000 ug | INTRAMUSCULAR | Status: DC | PRN
  Administered 2014-12-19: 25 ug via INTRAVENOUS

## 2014-12-19 MED ORDER — CEFAZOLIN SODIUM-DEXTROSE 2-3 GM-% IV SOLR
INTRAVENOUS | Status: AC
  Filled 2014-12-19: qty 50

## 2014-12-19 MED ORDER — PHENYLEPHRINE 40 MCG/ML (10ML) SYRINGE FOR IV PUSH (FOR BLOOD PRESSURE SUPPORT)
PREFILLED_SYRINGE | INTRAVENOUS | Status: AC
  Filled 2014-12-19: qty 10

## 2014-12-19 MED ORDER — SENNA-DOCUSATE SODIUM 8.6-50 MG PO TABS: 2.0000 | ORAL_TABLET | Freq: Every day | ORAL | Status: DC

## 2014-12-19 MED ORDER — ONDANSETRON HCL 4 MG/2ML IJ SOLN
INTRAMUSCULAR | Status: AC
  Filled 2014-12-19: qty 2

## 2014-12-19 MED ORDER — ONDANSETRON HCL 4 MG/2ML IJ SOLN
INTRAMUSCULAR | Status: DC | PRN
  Administered 2014-12-19: 4 mg via INTRAVENOUS

## 2014-12-19 MED ORDER — HYDROMORPHONE HCL 1 MG/ML IJ SOLN
INTRAMUSCULAR | Status: AC
  Filled 2014-12-19: qty 1

## 2014-12-19 SURGICAL SUPPLY — 36 items
BANDAGE ELASTIC 3 VELCRO ST LF (GAUZE/BANDAGES/DRESSINGS) ×3 IMPLANT
BLADE SURG 15 STRL LF DISP TIS (BLADE) ×1 IMPLANT
BLADE SURG 15 STRL SS (BLADE) ×3
BNDG CMPR 9X4 STRL LF SNTH (GAUZE/BANDAGES/DRESSINGS) ×1
BNDG ESMARK 4X9 LF (GAUZE/BANDAGES/DRESSINGS) ×2 IMPLANT
CORDS BIPOLAR (ELECTRODE) ×3 IMPLANT
COVER BACK TABLE 60X90IN (DRAPES) ×3 IMPLANT
CUFF TOURNIQUET SINGLE 18IN (TOURNIQUET CUFF) ×2 IMPLANT
DRAPE EXTREMITY T 121X128X90 (DRAPE) ×3 IMPLANT
DRAPE SURG 17X23 STRL (DRAPES) ×3 IMPLANT
DRAPE U 20/CS (DRAPES) ×3 IMPLANT
DURAPREP 26ML APPLICATOR (WOUND CARE) ×3 IMPLANT
GAUZE SPONGE 4X4 12PLY STRL (GAUZE/BANDAGES/DRESSINGS) ×3 IMPLANT
GAUZE XEROFORM 1X8 LF (GAUZE/BANDAGES/DRESSINGS) ×3 IMPLANT
GLOVE BIO SURGEON STRL SZ7 (GLOVE) ×2 IMPLANT
GLOVE BIO SURGEON STRL SZ8 (GLOVE) ×3 IMPLANT
GLOVE BIOGEL PI IND STRL 7.5 (GLOVE) IMPLANT
GLOVE BIOGEL PI IND STRL 8 (GLOVE) ×2 IMPLANT
GLOVE BIOGEL PI INDICATOR 7.5 (GLOVE) ×4
GLOVE BIOGEL PI INDICATOR 8 (GLOVE) ×4
GLOVE ORTHO TXT STRL SZ7.5 (GLOVE) ×3 IMPLANT
GOWN STRL REUS W/ TWL LRG LVL3 (GOWN DISPOSABLE) ×1 IMPLANT
GOWN STRL REUS W/ TWL XL LVL3 (GOWN DISPOSABLE) ×2 IMPLANT
GOWN STRL REUS W/TWL LRG LVL3 (GOWN DISPOSABLE) ×3
GOWN STRL REUS W/TWL XL LVL3 (GOWN DISPOSABLE) ×6
NDL HYPO 25X1 1.5 SAFETY (NEEDLE) IMPLANT
NEEDLE HYPO 25X1 1.5 SAFETY (NEEDLE) ×3 IMPLANT
NS IRRIG 1000ML POUR BTL (IV SOLUTION) ×3 IMPLANT
PACK BASIN DAY SURGERY FS (CUSTOM PROCEDURE TRAY) ×3 IMPLANT
PAD CAST 3X4 CTTN HI CHSV (CAST SUPPLIES) ×1 IMPLANT
PADDING CAST COTTON 3X4 STRL (CAST SUPPLIES) ×3
SUT ETHILON 4 0 PS 2 18 (SUTURE) ×3 IMPLANT
SYR BULB 3OZ (MISCELLANEOUS) ×3 IMPLANT
SYR CONTROL 10ML LL (SYRINGE) ×2 IMPLANT
TOWEL OR 17X24 6PK STRL BLUE (TOWEL DISPOSABLE) ×3 IMPLANT
UNDERPAD 30X30 (UNDERPADS AND DIAPERS) ×3 IMPLANT

## 2014-12-19 NOTE — Anesthesia Postprocedure Evaluation (Signed)
Anesthesia Post Note  Patient: Stacey Wells  Procedure(s) Performed: Procedure(s) (LRB): RELEASE RIGHT RING TRIGGER FINGER/A-1 PULLEY (Right)  Anesthesia type: MAC  Patient location: PACU  Post pain: Pain level controlled  Post assessment: Patient's Cardiovascular Status Stable  Last Vitals:  Filed Vitals:   12/19/14 1328  BP: 168/75  Pulse: 66  Temp: 36.5 C  Resp: 16    Post vital signs: Reviewed and stable  Level of consciousness: sedated  Complications: No apparent anesthesia complications

## 2014-12-19 NOTE — H&P (Signed)
PREOPERATIVE H&P  Chief Complaint: trigger finger, right ring finger M65.341  HPI: Stacey Wells is a 74 y.o. female who presents for preoperative history and physical with a diagnosis of trigger finger, right ring finger M65.341. Symptoms are rated as moderate to severe, and have been worsening.  This is significantly impairing activities of daily living.  She has elected for surgical management. Her finger has been actually locked for quite a long time, and she has difficulty with daily function, and cannot extend her finger. She has failed injections, as well as bracing.  Past Medical History  Diagnosis Date  . Arthritis   . Dyslipidemia   . Hypertension   . Cancer ([place]) 09/1999    BREAST  . Dysthymia   . Glucose intolerance (impaired glucose tolerance)   . 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 Renal insufficiency   . Anxiety   . Dementia     on Namenda which has caused good improvement  . Migraine headache     none since menopause  . PONV (postoperative nausea and vomiting)     has had with all surgeries  . Syncope Sept, 2016    did not seek medical care  . Frequent falls     pt states she falls frequently  . Stroke [facility]) 2015    Pt states was told she had a "mild stroke", no deficits   Past Surgical History  Procedure Laterality Date  . Elbow surgery Right   . Replacement total knee Bilateral 2002/2003  . Breast lumpectomy Right 2001  . Joint replacement    . Dilation and curettage of uterus     Social History   Social History  . Marital Status: Married    Spouse Name: N/A  . Number of Children: N/A  . Years of Education: N/A   Social History Main Topics  . Smoking status: Never Smoker   . Smokeless tobacco: Never Used  . Alcohol Use: 0.0 oz/week    0 Standard drinks or equivalent per week     Comment: daily glass of wine with dinner  . Drug Use: No  . Sexual Activity: Not Currently   Other Topics Concern  . None   Social History  Narrative   Married, 4 children   Right handed   12 th grade   1 cup daily   History reviewed. No pertinent family history. Allergies  Allergen Reactions  . Aricept [Donepezil Hcl] Nausea And Vomiting    vomiting  . Prozac [Fluoxetine Hcl] Other (See Comments)    hallucinations  . Statins Other (See Comments)    Dizziness and lightheadedness.   Prior to Admission medications   Medication Sig Start Date End Date Taking? Authorizing Provider  ibuprofen (ADVIL,MOTRIN) 200 MG tablet Take 400 mg by mouth daily. May take an additional tablet prn   Yes Historical Provider, MD  memantine (NAMENDA XR) 28 MG CP24 24 hr capsule Take 1 capsule (28 mg total) by mouth daily. 06/24/14  Yes Carmen Dohmeier, MD  nebivolol (BYSTOLIC) 5 MG tablet Take 1 tablet (5 mg total) by mouth daily. 05/17/14  Yes Denita Lung, MD  amitriptyline (ELAVIL) 50 MG tablet Take 1 tablet (50 mg total) by mouth at bedtime. 05/17/14 05/17/15  Denita Lung, MD     Positive ROS: All other systems have been reviewed and were otherwise negative with the exception of those mentioned in the HPI and as above.  Physical Exam: General: Alert,  no acute distress Cardiovascular: No pedal edema Respiratory: No cyanosis, no use of accessory musculature GI: No organomegaly, abdomen is soft and non-tender Skin: No lesions in the area of chief complaint Neurologic: Sensation intact distally Psychiatric: Patient is competent for consent with normal mood and affect Lymphatic: No axillary or cervical lymphadenopathy  MUSCULOSKELETAL: Right ring finger is locked at about 90 of PIP flexion. She has significant pain with a palpable nodule over the A1 pulley. Sensations intact distally in the fingers.  Assessment: trigger finger, right ring finger M65.341   Plan: Plan for Procedure(s): RELEASE RIGHT RING TRIGGER FINGER/A-1 PULLEY  The risks benefits and alternatives were discussed with the patient including but not limited to the  risks of nonoperative treatment, versus surgical intervention including infection, bleeding, nerve injury,  blood clots, cardiopulmonary complications, morbidity, mortality, among others, and they were willing to proceed.   Johnny Bridge, MD Cell [phone]   12/19/2014 11:16 AM

## 2014-12-19 NOTE — Transfer of Care (Signed)
Immediate Anesthesia Transfer of Care Note  Patient: Stacey Wells  Procedure(s) Performed: Procedure(s): RELEASE RIGHT RING TRIGGER FINGER/A-1 PULLEY (Right)  Patient Location: PACU  Anesthesia Type:MAC  Level of Consciousness: awake, alert  and oriented  Airway & Oxygen Therapy: Patient Spontanous Breathing and Patient connected to face mask oxygen  Post-op Assessment: Report given to RN and Post -op Vital signs reviewed and stable  Post vital signs: Reviewed and stable  Last Vitals:  Filed Vitals:   12/19/14 0908  BP: 184/86  Pulse: 81  Temp: 36.6 C  Resp: 20    Complications: No apparent anesthesia complications

## 2014-12-19 NOTE — Anesthesia Preprocedure Evaluation (Signed)
Anesthesia Evaluation  Patient identified by MRN, date of birth, ID band Patient awake    Reviewed: Allergy & Precautions, NPO status , Patient's Chart, lab work & pertinent test results  History of Anesthesia Complications (+) PONV  Airway Mallampati: I  TM Distance: >3 FB Neck ROM: Full    Dental   Pulmonary    Pulmonary exam normal        Cardiovascular hypertension, Pt. on medications Normal cardiovascular exam     Neuro/Psych Anxiety Depression    GI/Hepatic   Endo/Other    Renal/GU      Musculoskeletal   Abdominal   Peds  Hematology   Anesthesia Other Findings   Reproductive/Obstetrics                             Anesthesia Physical Anesthesia Plan  ASA: III  Anesthesia Plan: MAC   Post-op Pain Management:    Induction: Intravenous  Airway Management Planned: Simple Face Mask  Additional Equipment:   Intra-op Plan:   Post-operative Plan:   Informed Consent: I have reviewed the patients History and Physical, chart, labs and discussed the procedure including the risks, benefits and alternatives for the proposed anesthesia with the patient or authorized representative who has indicated his/her understanding and acceptance.     Plan Discussed with: CRNA and Surgeon  Anesthesia Plan Comments:         Anesthesia Quick Evaluation

## 2014-12-19 NOTE — Op Note (Signed)
12/19/2014  11:57 AM  PATIENT:  Stacey Wells    PRE-OPERATIVE DIAGNOSIS:  trigger finger, right ring finger M65.341  POST-OPERATIVE DIAGNOSIS:  Same  PROCEDURE:  RELEASE RIGHT RING TRIGGER FINGER/A-1 PULLEY  SURGEON:  Johnny Bridge, MD  PHYSICIAN ASSISTANT: Joya Gaskins, OPA-C, present and scrubbed throughout the case, critical for completion in a timely fashion, and for retraction, instrumentation, and closure.  ANESTHESIA:   General  PREOPERATIVE INDICATIONS:  [name] is a  74 y.o. female with a diagnosis of trigger finger, right ring finger M65.341 who failed conservative measures and elected for surgical management.  She had a locked right ring finger, that had been there for quite a long time, unable to straighten it out.  The risks benefits and alternatives were discussed with the patient preoperatively including but not limited to the risks of infection, bleeding, nerve injury, cardiopulmonary complications, the need for revision surgery, among others, and the patient was willing to proceed. We also discussed the risks for recurrent stiffness, incomplete relief of pain, and tendon injury.  OPERATIVE IMPLANTS: None  OPERATIVE FINDINGS: Substantial locking of the ring finger, with thickened A1 pulley.  OPERATIVE PROCEDURE: The patient is brought to the operating room placed in supine position. Local anesthesia with monitored anesthesia care was administered. The right upper extremity is prepped and draped in usual sterile fashion. Time out performed. The arm was elevated and exsanguinated and tourniquet was inflated. Total tourniquet time was 8 minutes. Incision was made over the A1 pulley in the line with the crease, after I injected Marcaine and Xylocaine. Once satisfactory anesthesia had been achieved I exposed the A1 pulley and released it completely with a knife. The finger was then free to move. This was an extremely thick pulley, and there was a fair amount of  nodularity to the tendon.  The wounds were irrigated copiously and then skin repaired with nylon followed by sterile gauze. She was awakened and returned to the PACU in stable and satisfactory condition.

## 2014-12-19 NOTE — Discharge Instructions (Signed)
Diet: As you were doing prior to hospitalization   Shower:  May shower but keep the wounds dry, use an occlusive plastic wrap, NO SOAKING IN TUB.  If the bandage gets wet, change with a clean dry gauze.  If you have a splint on, leave the splint in place and keep the splint dry with a plastic bag.  Dressing:  You may change your dressing 3-5 days after surgery, unless you have a splint.  If you have a splint, then just leave the splint in place and we will change your bandages during your first follow-up appointment.    If you had hand or foot surgery, we will plan to remove your stitches in about 2 weeks in the office.  For all other surgeries, there are sticky tapes (steri-strips) on your wounds and all the stitches are absorbable.  Leave the steri-strips in place when changing your dressings, they will peel off with time, usually 2-3 weeks.  Activity:  Increase activity slowly as tolerated, but follow the weight bearing instructions below.  The rules on driving is that you can not be taking narcotics while you drive, and you must feel in control of the vehicle.    Weight Bearing:   As tolerated.  Keep hand dry.    To prevent constipation: you may use a stool softener such as -  Colace (over the counter) 100 mg by mouth twice a day  Drink plenty of fluids (prune juice may be helpful) and high fiber foods Miralax (over the counter) for constipation as needed.    Itching:  If you experience itching with your medications, try taking only a single pain pill, or even half a pain pill at a time.  You may take up to 10 pain pills per day, and you can also use benadryl over the counter for itching or also to help with sleep.   Precautions:  If you experience chest pain or shortness of breath - call 911 immediately for transfer to the hospital emergency department!!  If you develop a fever greater that 101 F, purulent drainage from wound, increased redness or drainage from wound, or calf pain -- Call  the office at [phone]                                                Follow- Up Appointment:  Please call for an appointment to be seen in 2 weeks [place] - [phone]      Post Anesthesia Home Care Instructions  Activity: Get plenty of rest for the remainder of the day. A responsible adult should stay with you for 24 hours following the procedure.  For the next 24 hours, DO NOT: -Drive a car -Paediatric nurse -Drink alcoholic beverages -Take any medication unless instructed by your physician -Make any legal decisions or sign important papers.  Meals: Start with liquid foods such as gelatin or soup. Progress to regular foods as tolerated. Avoid greasy, spicy, heavy foods. If nausea and/or vomiting occur, drink only clear liquids until the nausea and/or vomiting subsides. Call your physician if vomiting continues.  Special Instructions/Symptoms: Your throat may feel dry or sore from the anesthesia or the breathing tube placed in your throat during surgery. If this causes discomfort, gargle with warm salt water. The discomfort should disappear within 24 hours.  If you had a scopolamine patch placed behind your  ear for the management of post- operative nausea and/or vomiting:  1. The medication in the patch is effective for 72 hours, after which it should be removed.  Wrap patch in a tissue and discard in the trash. Wash hands thoroughly with soap and water. 2. You may remove the patch earlier than 72 hours if you experience unpleasant side effects which may include dry mouth, dizziness or visual disturbances. 3. Avoid touching the patch. Wash your hands with soap and water after contact with the patch.

## 2014-12-22 ENCOUNTER — Encounter (HOSPITAL_BASED_OUTPATIENT_CLINIC_OR_DEPARTMENT_OTHER): Payer: Self-pay | Admitting: Orthopedic Surgery

## 2015-01-27 ENCOUNTER — Other Ambulatory Visit: Payer: Self-pay | Admitting: Neurology

## 2015-02-04 ENCOUNTER — Encounter: Payer: Self-pay | Admitting: Nurse Practitioner

## 2015-02-04 ENCOUNTER — Ambulatory Visit (INDEPENDENT_AMBULATORY_CARE_PROVIDER_SITE_OTHER): Payer: PPO | Admitting: Nurse Practitioner

## 2015-02-04 VITALS — BP 148/88 | HR 82 | Ht 65.5 in | Wt 217.8 lb

## 2015-02-04 DIAGNOSIS — G3184 Mild cognitive impairment, so stated: Secondary | ICD-10-CM | POA: Diagnosis not present

## 2015-02-04 DIAGNOSIS — R2681 Unsteadiness on feet: Secondary | ICD-10-CM | POA: Diagnosis not present

## 2015-02-04 MED ORDER — MEMANTINE HCL ER 28 MG PO CP24: ORAL_CAPSULE | ORAL | Status: DC

## 2015-02-04 NOTE — Progress Notes (Signed)
[facility]  PATIENT: Stacey Wells DOB: 1940-07-15   REASON FOR VISIT: Mild cognitive impairment HISTORY FROM: Patient alone   HISTORY OF PRESENT ILLNESS:Stacey Wells, 74 year old female returns for follow-up. She was last seen 08/11/14 . She was placed on Aricept in March 2016  but had vomiting from the medication. She was then placed on Namenda extended release and titrated slowly. The patient feels [name] has been beneficial and she feels better. She did have a PT evaluation but the patient does not feel like physical therapy was beneficial for her. She denies any falls since last seen . She continues to do her household duties without difficulty, cooking cleaning etc. she returns for reevaluation. She has not taking her Namenda in 2 days, she wanted to make certain the medication was to continue.  HISTORY: CDMrs. Wells , a married , right handed caucasian patient from [place] reports today that she has continued to fall frequently. Apparently she was seen by Dr. Janann Colonel for the same concern in addition at his visit with him she had mentioned a cognitive concern. After being evaluated by the movement disorder specialist, she was diagnosed with Parkinson's syndrome, which terrifies her, of course.  She was also sent for an MRI of the brain which returned normal for age with some chronic microvascular ischemia that was only mild. The patient has an interesting story to her frequent falls she seems sometimes to have a warning that she knows she may follow but some of her falls occur out of the blue and very sudden. She has suffered injuries and she is visibly bruised for example on the lateral epicondyle of the right elbow, she describes large hematomata on her buttocks and on the abdomen left more than right. The vertebral fracture in the past. She has only once hit her head during a fall but at that time hit the chest of drawers with a hard surface. One time she fell  outside and hit the back of her head on the grass, but she had trouble to get back up. The falls appear independent of time I'm of day, or surrounding, of proximity to mealtimes, of fluid intake, this seems to be no special activity that may trigger it.  They can occur as the patient stands or sits or walks. Independent of the falls the patient has had some episodes of sudden weakness, one of them she describes as feeling a wave of heat coming over her she was extremely hot. She describes that she slid from a chair onto the bed and then couldn't find the strength to get up again and she was confused about her surroundings. It may have taken her several minutes to reorient herself to the surroundings. She has also started to use night lights as she feels more disoriented in the dark. She was referred for cardiac monitoring and this resulted in normal tracing.   She had a normal EEG at this office.  She reports ongoing confusion, getting lost in a familiar environment and gets parts of conversations. Her husband has noted that there is a cognitive problem and he has mentioned is here today. My nurse Lovey Newcomer performed a [place] cognitive assessment test today with the patient. He was able to name 3 animals, she was perfectly fine in drawing a clock face and placing the hands of the clock at 11:10. She had trouble copying a every image and the Trail Making Test gave her difficulties. Her attention was excellent and she was able to  repeat a list of digits without problem. She performed 2 out of 5 subtractions correctly, but she did not word for word repeat the sentences given to her. MOCA was was tested and the patient was able minutes several minutes after the initial test was performed to recall 4 out of 5 words. She appears anxious, afraid to fail. 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 No known strokes or TIAs. Per patient she has a lumbar fracture, she is followed by ortho for this and  is wearing a brace. She is taking tramadol as needed for pain.     REVIEW OF SYSTEMS: Full 14 system review of systems performed and notable only for those listed, all others are neg:  Constitutional: neg  Cardiovascular: neg Ear/Nose/Throat: neg  Skin: neg Eyes: neg Respiratory: neg Gastroitestinal: neg  Hematology/Lymphatic: neg  Endocrine: neg Musculoskeletal:neg Allergy/Immunology: neg Neurological: neg Psychiatric: neg Sleep : neg   ALLERGIES: Allergies  Allergen Reactions  . Aricept [Donepezil Hcl] Nausea And Vomiting    vomiting  . Prozac [Fluoxetine Hcl] Other (See Comments)    hallucinations  . Statins Other (See Comments)    Dizziness and lightheadedness.    HOME MEDICATIONS: Outpatient Prescriptions Prior to Visit  Medication Sig Dispense Refill  . amitriptyline (ELAVIL) 50 MG tablet Take 1 tablet (50 mg total) by mouth at bedtime. 90 tablet 3  . nebivolol (BYSTOLIC) 5 MG tablet Take 1 tablet (5 mg total) by mouth daily. 90 tablet 3  . NAMENDA XR 28 MG CP24 24 hr capsule TAKE 1 CAPSULE(28 MG) BY MOUTH DAILY (Patient not taking: Reported on 02/04/2015) 30 capsule 0  . HYDROcodone-acetaminophen (NORCO) 5-325 MG tablet Take 1-2 tablets by mouth every 6 (six) hours as needed for moderate pain. MAXIMUM TOTAL ACETAMINOPHEN DOSE IS 4000 MG PER DAY (Patient not taking: Reported on 02/04/2015) 30 tablet 0  . ibuprofen (ADVIL,MOTRIN) 200 MG tablet Take 400 mg by mouth daily. May take an additional tablet prn    . ondansetron (ZOFRAN) 4 MG tablet Take 1 tablet (4 mg total) by mouth every 8 (eight) hours as needed for nausea or vomiting. (Patient not taking: Reported on 02/04/2015) 30 tablet 0  . sennosides-docusate sodium (SENOKOT-S) 8.6-50 MG tablet Take 2 tablets by mouth daily. 30 tablet 1   No facility-administered medications prior to visit.    PAST MEDICAL HISTORY: Past Medical History  Diagnosis Date  . Arthritis   . Dyslipidemia   . Hypertension   . Cancer  ([place]) 09/1999    BREAST  . Dysthymia   . Glucose intolerance (impaired glucose tolerance)   . 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 Renal insufficiency   . Anxiety   . Dementia     on Namenda which has caused good improvement  . Migraine headache     none since menopause  . PONV (postoperative nausea and vomiting)     has had with all surgeries  . Syncope Sept, 2016    did not seek medical care  . Frequent falls     pt states she falls frequently  . Stroke [facility]) 2015    Pt states was told she had a "mild stroke", no deficits  . Trigger ring finger of right hand 12/19/2014    PAST SURGICAL HISTORY: Past Surgical History  Procedure Laterality Date  . Elbow surgery Right   . Replacement total knee Bilateral 2002/2003  . Breast lumpectomy Right 2001  . Joint replacement    .  Dilation and curettage of uterus    . Trigger finger release Right 12/19/2014    Procedure: RELEASE RIGHT RING TRIGGER FINGER/A-1 PULLEY;  Surgeon: Marchia Bond, MD;  Location: [facility];  Service: Orthopedics;  Laterality: Right;    FAMILY HISTORY: History reviewed. No pertinent family history.  SOCIAL HISTORY: Social History   Social History  . Marital Status: Married    Spouse Name: N/A  . Number of Children: N/A  . Years of Education: N/A   Occupational History  . Not on file.   Social History Main Topics  . Smoking status: Never Smoker   . Smokeless tobacco: Never Used  . Alcohol Use: 0.0 oz/week    0 Standard drinks or equivalent per week     Comment: daily glass of wine with dinner  . Drug Use: No  . Sexual Activity: Not Currently   Other Topics Concern  . Not on file   Social History Narrative   Married, 4 children   Right handed   12 th grade   1 cup daily     PHYSICAL EXAM  Filed Vitals:   02/04/15 1426  BP: 148/88  Pulse: 82  Height: 5' 5.5" (1.664 m)  Weight: 217 lb 12.8 oz (98.793 kg)   Body mass index is 35.68  kg/(m^2). General: The patient is awake, alert and appears not in acute distress. The patient is well groomed. Head: Normocephalic, atraumatic.  Neck is supple. No bruit Cardiovascular: Regular rate and rhythm , without murmurs  Respiratory: Lungs are clear to auscultation. Skin: Without evidence of Rash,  Neurologic exam :The patient is awake and alert, oriented to place and time. [place] 23/30 last 23/30. AFT 18. Speech is fluent without, dysphonia. Mood and affect are appropriate. Cranial nerves:Pupils are equal and briskly reactive to light. Funduscopic exam without evidence of pallor or edema. Extraocular movements in vertical and horizontal planes intact and without nystagmus. Visual fields by finger perimetry are intact.Hearing to finger rub intact. Facial sensation intact to fine touch. Facial motor strength is symmetric and tongue and uvula move midline. Motor exam: Normal tone and normal muscle bulk and symmetric normal strength in all extremities No cogwheel rigidity nor spasticity. No resting tremor . Sensory: Fine touch, pinprick and vibration were tested in all extremities.  Coordination: Rapid alternating movements in the fingers/hands is tested and normal.Finger-to-nose maneuver tested and normal without evidence of ataxia, dysmetria or tremor. Gait and station: Patient walks without assistive device. Stance is stable and normal. The patient has a normal arm swing when she walks, she could turn with 3-4 steps she did not appear to be drifting.     DIAGNOSTIC DATA (LABS, IMAGING, TESTING) - I reviewed patient records, labs, notes, testing and imaging myself where available.  Lab Results  Component Value Date   WBC 5.0 04/14/2014   HGB 13.8 04/14/2014   HCT 41.4 04/14/2014   MCV 90.6 04/14/2014   PLT 197 04/14/2014      Component Value Date/Time   NA 137 04/14/2014 1206   K 4.4 04/14/2014 1206   CL 100 04/14/2014 1206   CO2 26 04/14/2014 1206   GLUCOSE 91  04/14/2014 1206   BUN 27* 04/14/2014 1206   CREATININE 1.19* 04/14/2014 1206   CREATININE 1.44* 07/30/2009 1357   CALCIUM 9.7 04/14/2014 1206   PROT 7.0 04/14/2014 1206   PROT 6.9 07/01/2013 1404   ALBUMIN 3.9 04/14/2014 1206   AST 24 04/14/2014 1206   ALT 19 04/14/2014 1206  ALKPHOS 75 04/14/2014 1206   BILITOT 0.6 04/14/2014 1206   Lab Results  Component Value Date   CHOL 330* 04/14/2014   HDL 69 04/14/2014   LDLCALC 233* 04/14/2014   TRIG 140 04/14/2014   CHOLHDL 4.8 04/14/2014    ASSESSMENT AND PLAN 74 y.o. year old female has a past medical history of mild cognitive impairment with improvement in her MOCA score since being placed on Namenda extended release. She vomited on Aricept. She did have physical therapy evaluation for her frequent falls however she canceled the rest of the appointments. She has not fallen  since last seen.  MOCA score is stable Continue Namenda 28 mg extended release daily Rx to patient per request Follow-up in 6 months Dennie Bible, [facility], [facility], APRN  [facility] [address] [place], [place] [address] [phone]

## 2015-02-04 NOTE — Progress Notes (Signed)
I agree with the assessment and plan as directed by NP .The patient is known to me .   Penelope Fittro, MD  

## 2015-02-04 NOTE — Patient Instructions (Signed)
Memory score stable Continue Namenda at current dose Rx to patient Follow-up in 6 months

## 2015-02-10 ENCOUNTER — Ambulatory Visit: Payer: PPO | Admitting: Nurse Practitioner

## 2015-05-24 ENCOUNTER — Other Ambulatory Visit: Payer: Self-pay | Admitting: Family Medicine

## 2015-05-25 NOTE — Telephone Encounter (Signed)
Is this ok to refill?  

## 2015-06-11 DIAGNOSIS — M5136 Other intervertebral disc degeneration, lumbar region: Secondary | ICD-10-CM | POA: Diagnosis not present

## 2015-06-11 DIAGNOSIS — M545 Low back pain: Secondary | ICD-10-CM | POA: Diagnosis not present

## 2015-06-11 DIAGNOSIS — M47816 Spondylosis without myelopathy or radiculopathy, lumbar region: Secondary | ICD-10-CM | POA: Diagnosis not present

## 2015-06-11 DIAGNOSIS — M9903 Segmental and somatic dysfunction of lumbar region: Secondary | ICD-10-CM | POA: Diagnosis not present

## 2015-07-13 ENCOUNTER — Other Ambulatory Visit: Payer: Self-pay

## 2015-07-13 ENCOUNTER — Telehealth: Payer: Self-pay

## 2015-07-13 DIAGNOSIS — I10 Essential (primary) hypertension: Secondary | ICD-10-CM

## 2015-07-13 MED ORDER — NEBIVOLOL HCL 5 MG PO TABS: 5.0000 mg | ORAL_TABLET | Freq: Every day | ORAL | Status: DC

## 2015-07-13 MED ORDER — MEMANTINE HCL ER 28 MG PO CP24: ORAL_CAPSULE | ORAL | Status: DC

## 2015-07-13 NOTE — Telephone Encounter (Signed)
Sent med in 

## 2015-07-13 NOTE — Telephone Encounter (Signed)
Pt called needs 90 day refill of Bystolic and Namenda sent to [facility] on [address]. Pt has pending appt scheduled for 07/23/2015. Victorino December

## 2015-07-23 ENCOUNTER — Encounter: Payer: Self-pay | Admitting: Family Medicine

## 2015-07-23 ENCOUNTER — Ambulatory Visit (INDEPENDENT_AMBULATORY_CARE_PROVIDER_SITE_OTHER): Payer: PPO | Admitting: Family Medicine

## 2015-07-23 VITALS — BP 130/100 | HR 70 | Ht 66.0 in | Wt 217.0 lb

## 2015-07-23 DIAGNOSIS — Z789 Other specified health status: Secondary | ICD-10-CM

## 2015-07-23 DIAGNOSIS — Z889 Allergy status to unspecified drugs, medicaments and biological substances status: Secondary | ICD-10-CM

## 2015-07-23 DIAGNOSIS — I10 Essential (primary) hypertension: Secondary | ICD-10-CM

## 2015-07-23 DIAGNOSIS — E785 Hyperlipidemia, unspecified: Secondary | ICD-10-CM

## 2015-07-23 DIAGNOSIS — G479 Sleep disorder, unspecified: Secondary | ICD-10-CM

## 2015-07-23 DIAGNOSIS — F341 Dysthymic disorder: Secondary | ICD-10-CM

## 2015-07-23 DIAGNOSIS — G3184 Mild cognitive impairment, so stated: Secondary | ICD-10-CM | POA: Diagnosis not present

## 2015-07-23 DIAGNOSIS — Z79899 Other long term (current) drug therapy: Secondary | ICD-10-CM

## 2015-07-23 DIAGNOSIS — J301 Allergic rhinitis due to pollen: Secondary | ICD-10-CM

## 2015-07-23 DIAGNOSIS — R413 Other amnesia: Secondary | ICD-10-CM | POA: Diagnosis not present

## 2015-07-23 LAB — CBC WITH DIFFERENTIAL/PLATELET
Basophils Absolute: 0 cells/uL (ref 0–200)
Basophils Relative: 0 %
Eosinophils Absolute: 110 cells/uL (ref 15–500)
Eosinophils Relative: 2 %
HEMATOCRIT: 42.7 % (ref 35.0–45.0)
Hemoglobin: 14.4 g/dL (ref 11.7–15.5)
LYMPHS PCT: 41 %
Lymphs Abs: 2255 cells/uL (ref 850–3900)
MCH: 31 pg (ref 27.0–33.0)
MCHC: 33.7 g/dL (ref 32.0–36.0)
MCV: 92 fL (ref 80.0–100.0)
MONOS PCT: 6 %
MPV: 11.1 fL (ref 7.5–12.5)
Monocytes Absolute: 330 cells/uL (ref 200–950)
NEUTROS PCT: 51 %
Neutro Abs: 2805 cells/uL (ref 1500–7800)
Platelets: 180 10*3/uL (ref 140–400)
RBC: 4.64 MIL/uL (ref 3.80–5.10)
RDW: 13.8 % (ref 11.0–15.0)
WBC: 5.5 10*3/uL (ref 4.0–10.5)

## 2015-07-23 LAB — COMPREHENSIVE METABOLIC PANEL
ALT: 17 U/L (ref 6–29)
AST: 27 U/L (ref 10–35)
Albumin: 4.2 g/dL (ref 3.6–5.1)
Alkaline Phosphatase: 67 U/L (ref 33–130)
BUN: 23 mg/dL (ref 7–25)
CALCIUM: 9.3 mg/dL (ref 8.6–10.4)
CO2: 24 mmol/L (ref 20–31)
Chloride: 103 mmol/L (ref 98–110)
Creat: 1.25 mg/dL — ABNORMAL HIGH (ref 0.60–0.93)
GLUCOSE: 86 mg/dL (ref 65–99)
POTASSIUM: 4.5 mmol/L (ref 3.5–5.3)
Sodium: 139 mmol/L (ref 135–146)
Total Bilirubin: 0.6 mg/dL (ref 0.2–1.2)
Total Protein: 6.9 g/dL (ref 6.1–8.1)

## 2015-07-23 MED ORDER — CITALOPRAM HYDROBROMIDE 10 MG PO TABS: 10.0000 mg | ORAL_TABLET | Freq: Every day | ORAL | Status: DC

## 2015-07-23 NOTE — Patient Instructions (Signed)
Call me in 1 month preferably on My Chart and let me know how you're doing.

## 2015-07-24 ENCOUNTER — Encounter: Payer: Self-pay | Admitting: Family Medicine

## 2015-07-24 NOTE — Progress Notes (Signed)
   Subjective:    Patient ID: Stacey Wells, female    DOB: 1940/04/10, 75 y.o.   MRN: [id number]  HPI She is here for medication management visit. Since starting on Namenda she has noted an improvement in her cognitive ability. She also states that she is not falling as much as she has in the past. She has also noted difficulty with sleep disturbance recently as well as increased mood swings. She did stop her Prozac several months ago and she also stopped amitriptyline was originally given to her for migraine headaches. She has not had any of these recently. She stopped the Prozac thinking she was doing much better. She does have underlying allergies and states that these are under good control. She uses OTC meds for this. She does have history of statin intolerance as well as hyperlipidemia. She continues on by systolic for her blood pressure.   Review of Systems     Objective:   Physical Exam Alert and in no distress appropriate affect. Tympanic membranes and canals are normal. Pharyngeal area is normal. Neck is supple without adenopathy or thyromegaly. Cardiac exam shows a regular sinus rhythm without murmurs or gallops. Lungs are clear to auscultation.        Assessment & Plan:  Essential hypertension - Plan: CBC with Differential/Platelet, Comprehensive metabolic panel  Dysthymia - Plan: citalopram (CELEXA) 10 MG tablet, CBC with Differential/Platelet, Comprehensive metabolic panel  MCI (mild cognitive impairment) with memory loss - Plan: CBC with Differential/Platelet, Comprehensive metabolic panel  Sleep disturbance  Allergic rhinitis due to pollen  Encounter for long-term (current) use of medications - Plan: CBC with Differential/Platelet, Comprehensive metabolic panel  Hyperlipidemia with target LDL less than 100  Statin intolerance  Amnestic MCI (mild cognitive impairment with memory loss) I will place her on Celexa. She is to call me in 1 month to let me know how  this is doing to help with her mood as well as with her sleep. She will continue on her other medications. Also prescription written for TDaP per pharmacy. He will continue to withhold amitriptyline.

## 2015-07-30 DIAGNOSIS — H25013 Cortical age-related cataract, bilateral: Secondary | ICD-10-CM | POA: Diagnosis not present

## 2015-07-30 DIAGNOSIS — H2513 Age-related nuclear cataract, bilateral: Secondary | ICD-10-CM | POA: Diagnosis not present

## 2015-08-04 ENCOUNTER — Ambulatory Visit (INDEPENDENT_AMBULATORY_CARE_PROVIDER_SITE_OTHER): Payer: PPO | Admitting: Nurse Practitioner

## 2015-08-04 ENCOUNTER — Encounter: Payer: Self-pay | Admitting: Nurse Practitioner

## 2015-08-04 VITALS — BP 156/96 | HR 88 | Ht 66.0 in | Wt 215.0 lb

## 2015-08-04 DIAGNOSIS — R2681 Unsteadiness on feet: Secondary | ICD-10-CM | POA: Diagnosis not present

## 2015-08-04 DIAGNOSIS — G3184 Mild cognitive impairment, so stated: Secondary | ICD-10-CM | POA: Diagnosis not present

## 2015-08-04 NOTE — Patient Instructions (Signed)
MOCA score is stable Continue Namenda 28 mg extended release daily Rx to patient per request Follow-up in 6 months

## 2015-08-04 NOTE — Progress Notes (Signed)
I agree with the assessment and plan as directed by NP .The patient is known to me .   Rohan Juenger, MD  

## 2015-08-04 NOTE — Progress Notes (Signed)
[facility]  PATIENT: Stacey Wells DOB: [birth year]   REASON FOR VISIT: Follow-up for mild cognitive impairment and gait abnormality HISTORY FROM: Patient and husband Stacey Wells    HISTORY OF PRESENT ILLNESS:Stacey Wells, 75 year old female returns for follow-up. She was last seen 02/04/15.  She was placed on Aricept in March 2016 but had vomiting from the medication. She was then placed on Namenda extended release and titrated slowly. The patient feels [name] has been beneficial and she feels better. She did have a PT evaluation but the patient does not feel like physical therapy was beneficial for her. She denies any falls since last seen . She has not had any falls since her statin drugs were discontinued. She continues to do her household duties without difficulty, cooking cleaning etc., she also has a garden.  She returns for reevaluation.   HISTORY: CDMrs. Wells , a married , right handed caucasian patient from [place] reports today that she has continued to fall frequently. Apparently she was seen by Dr. Janann Colonel for the same concern in addition at his visit with him she had mentioned a cognitive concern. After being evaluated by the movement disorder specialist, she was diagnosed with Parkinson's syndrome, which terrifies her, of course.  She was also sent for an MRI of the brain which returned normal for age with some chronic microvascular ischemia that was only mild. The patient has an interesting story to her frequent falls she seems sometimes to have a warning that she knows she may follow but some of her falls occur out of the blue and very sudden. She has suffered injuries and she is visibly bruised for example on the lateral epicondyle of the right elbow, she describes large hematomata on her buttocks and on the abdomen left more than right. The vertebral fracture in the past. She has only once hit her head during a fall but at that time hit the chest of drawers  with a hard surface. One time she fell outside and hit the back of her head on the grass, but she had trouble to get back up. The falls appear independent of time I'm of day, or surrounding, of proximity to mealtimes, of fluid intake, this seems to be no special activity that may trigger it.  They can occur as the patient stands or sits or walks. Independent of the falls the patient has had some episodes of sudden weakness, one of them she describes as feeling a wave of heat coming over her she was extremely hot. She describes that she slid from a chair onto the bed and then couldn't find the strength to get up again and she was confused about her surroundings. It may have taken her several minutes to reorient herself to the surroundings. She has also started to use night lights as she feels more disoriented in the dark. She was referred for cardiac monitoring and this resulted in normal tracing.   She had a normal EEG at this office.  She reports ongoing confusion, getting lost in a familiar environment and gets parts of conversations. Her husband has noted that there is a cognitive problem and he has mentioned is here today. My nurse Lovey Newcomer performed a [place] cognitive assessment test today with the patient. He was able to name 3 animals, she was perfectly fine in drawing a clock face and placing the hands of the clock at 11:10. She had trouble copying a every image and the Trail Making Test gave her difficulties. Her attention  was excellent and she was able to repeat a list of digits without problem. She performed 2 out of 5 subtractions correctly, but she did not word for word repeat the sentences given to her. MOCA was was tested and the patient was able minutes several minutes after the initial test was performed to recall 4 out of 5 words. She appears anxious, afraid to fail. 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 No known strokes or TIAs. Per patient she has a lumbar fracture,  she is followed by ortho for this and is wearing a brace. She is taking tramadol as needed for pain.    REVIEW OF SYSTEMS: Full 14 system review of systems performed and notable only for those listed, all others are neg:  Constitutional: neg  Cardiovascular: neg Ear/Nose/Throat: neg  Skin: neg Eyes: neg Respiratory: neg Gastroitestinal: neg  Hematology/Lymphatic: neg  Endocrine: neg Musculoskeletal:neg Allergy/Immunology: neg Neurological: Mild memory loss Psychiatric: neg Sleep : neg   ALLERGIES: Allergies  Allergen Reactions  . Aricept [Donepezil Hcl] Nausea And Vomiting    vomiting  . Prozac [Fluoxetine Hcl] Other (See Comments)    hallucinations  . Statins Other (See Comments)    Dizziness and lightheadedness.  . Sulfamethoxazole     HOME MEDICATIONS: Outpatient Prescriptions Prior to Visit  Medication Sig Dispense Refill  . citalopram (CELEXA) 10 MG tablet Take 1 tablet (10 mg total) by mouth daily. 30 tablet 1  . memantine (NAMENDA XR) 28 MG CP24 24 hr capsule TAKE 1 CAPSULE(28 MG) BY MOUTH DAILY 90 capsule 0  . nebivolol (BYSTOLIC) 5 MG tablet Take 1 tablet (5 mg total) by mouth daily. 90 tablet 3   No facility-administered medications prior to visit.    PAST MEDICAL HISTORY: Past Medical History  Diagnosis Date  . Arthritis   . Dyslipidemia   . Hypertension   . Cancer ([place]) 09/1999    BREAST  . Dysthymia   . Glucose intolerance (impaired glucose tolerance)   . 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 Renal insufficiency   . Anxiety   . Dementia     on Namenda which has caused good improvement  . Migraine headache     none since menopause  . PONV (postoperative nausea and vomiting)     has had with all surgeries  . Syncope Sept, 2016    did not seek medical care  . Frequent falls     pt states she falls frequently  . Stroke [facility]) 2015    Pt states was told she had a "mild stroke", no deficits  . Trigger ring finger of right hand  12/19/2014    PAST SURGICAL HISTORY: Past Surgical History  Procedure Laterality Date  . Elbow surgery Right   . Replacement total knee Bilateral 2002/2003  . Breast lumpectomy Right 2001  . Joint replacement    . Dilation and curettage of uterus    . Trigger finger release Right 12/19/2014    Procedure: RELEASE RIGHT RING TRIGGER FINGER/A-1 PULLEY;  Surgeon: Marchia Bond, MD;  Location: [facility];  Service: Orthopedics;  Laterality: Right;    FAMILY HISTORY: History reviewed. No pertinent family history.  SOCIAL HISTORY: Social History   Social History  . Marital Status: Married    Spouse Name: N/A  . Number of Children: N/A  . Years of Education: N/A   Occupational History  . Not on file.   Social History Main Topics  . Smoking  status: Never Smoker   . Smokeless tobacco: Never Used  . Alcohol Use: 0.0 oz/week    0 Standard drinks or equivalent per week     Comment: daily glass of wine with dinner  . Drug Use: No  . Sexual Activity: Not Currently   Other Topics Concern  . Not on file   Social History Narrative   Married, 4 children   Right handed   12 th grade   1 cup daily     PHYSICAL EXAM  Filed Vitals:   08/04/15 0905  BP: 156/96  Pulse: 88  Height: 5\' 6"  (1.676 m)  Weight: 215 lb (97.523 kg)   Body mass index is 34.72 kg/(m^2). General: The patient is awake, alert and appears not in acute distress. The patient is well groomed. Head: Normocephalic, atraumatic.  Neck is supple. No bruit Cardiovascular: Regular rate and rhythm , without murmurs  Skin: Without evidence of Rash,  Neurologic exam :The patient is awake and alert, oriented to place and time. MOCA 29/30 last 23/30. AFT 20. Speech is fluent without, dysphonia. Mood and affect are appropriate. Cranial nerves:Pupils are equal and briskly reactive to light. Funduscopic exam without evidence of pallor or edema. Extraocular movements in vertical and horizontal  planes intact and without nystagmus. Visual fields by finger perimetry are intact.Hearing to finger rub intact. Facial sensation intact to fine touch. Facial motor strength is symmetric and tongue and uvula move midline. Motor exam: Normal tone and normal muscle bulk and symmetric normal strength in all extremities No cogwheel rigidity nor spasticity. No resting tremor . Sensory: Fine touch, pinprick and vibration were tested in all extremities.  Coordination: Rapid alternating movements in the fingers/hands is tested and normal.Finger-to-nose maneuver tested and normal without evidence of ataxia, dysmetria or tremor. Gait and station: Patient walks without assistive device. Stance is stable and normal. The patient has a normal arm swing when she walks, she could turn with 3-4 steps she did not appear to be drifting.    DIAGNOSTIC DATA (LABS, IMAGING, TESTING) - I reviewed patient records, labs, notes, testing and imaging myself where available.  Lab Results  Component Value Date   WBC 5.5 07/23/2015   HGB 14.4 07/23/2015   HCT 42.7 07/23/2015   MCV 92.0 07/23/2015   PLT 180 07/23/2015      Component Value Date/Time   NA 139 07/23/2015 1333   K 4.5 07/23/2015 1333   CL 103 07/23/2015 1333   CO2 24 07/23/2015 1333   GLUCOSE 86 07/23/2015 1333   BUN 23 07/23/2015 1333   CREATININE 1.25* 07/23/2015 1333   CREATININE 1.44* 07/30/2009 1357   CALCIUM 9.3 07/23/2015 1333   PROT 6.9 07/23/2015 1333   PROT 6.9 07/01/2013 1404   ALBUMIN 4.2 07/23/2015 1333   AST 27 07/23/2015 1333   ALT 17 07/23/2015 1333   ALKPHOS 67 07/23/2015 1333   BILITOT 0.6 07/23/2015 1333     ASSESSMENT AND PLAN 75y.o. year old female has a past medical history of mild cognitive impairment with improvement in her MOCA score since being placed on Namenda extended release. She vomited on Aricept. She did have physical therapy evaluation for her frequent falls however she canceled the rest of the  appointments.She has not fallen since her statin drugs were discontinued  MOCA score is stable 29/30 Continue Namenda 28 mg extended release daily last refilled by Dr. Demetrios Isaacs Follow-up in 6 months next with [place], [facility], [facility], [facility] [phone]  [address] [place], [place] [address] [phone]

## 2015-08-19 ENCOUNTER — Telehealth: Payer: Self-pay | Admitting: Family Medicine

## 2015-08-19 MED ORDER — CITALOPRAM HYDROBROMIDE 20 MG PO TABS: 20.0000 mg | ORAL_TABLET | Freq: Every day | ORAL | Status: DC

## 2015-08-19 NOTE — Telephone Encounter (Signed)
Pt called and states that she thinks she needs a higher dose of Celexa she dosent think that the 10 mg is helping, she is still not sleeping, and still feels very emotional and not feeling relaxed.  Pt is going out town Friday and would like this by then sent in to the pharmacy, pt uses [id number] - , [place] - [address] AT [place] and pt  Can be reached at [phone] (M)

## 2015-08-19 NOTE — Telephone Encounter (Signed)
Let her know that I called the medicine in and have her check with me in one month

## 2015-08-20 NOTE — Telephone Encounter (Signed)
Left word for word message on machine  

## 2015-08-31 ENCOUNTER — Telehealth: Payer: Self-pay

## 2015-08-31 ENCOUNTER — Encounter: Payer: Self-pay | Admitting: Family Medicine

## 2015-08-31 ENCOUNTER — Ambulatory Visit (INDEPENDENT_AMBULATORY_CARE_PROVIDER_SITE_OTHER): Payer: PPO | Admitting: Family Medicine

## 2015-08-31 VITALS — BP 150/100 | HR 74 | Wt 211.0 lb

## 2015-08-31 DIAGNOSIS — F341 Dysthymic disorder: Secondary | ICD-10-CM

## 2015-08-31 DIAGNOSIS — G479 Sleep disorder, unspecified: Secondary | ICD-10-CM | POA: Diagnosis not present

## 2015-08-31 MED ORDER — DOXEPIN HCL 3 MG PO TABS: 1.0000 | ORAL_TABLET | Freq: Every evening | ORAL | Status: DC | PRN

## 2015-08-31 NOTE — Telephone Encounter (Signed)
Pt's husband called the office to let you know that pt is becoming easily tearful without anything triggering it. This morning this occurred just while she was reading the paper. They are not sure if you want to see her or if she should see a psychiatrist. Mr. Legare can be reached at [phone]

## 2015-08-31 NOTE — Progress Notes (Signed)
   Subjective:    Patient ID: Stacey Wells, female    DOB: 1940/05/28, 75 y.o.   MRN: [id number]  HPI She is here for an interval evaluation. She states that over the last 2 months she has had increasing difficulty especially with sleeping. Recently she has been noted to cry spontaneously for no reason and because upset and get angry over little things. She was seen by neurology for evaluation of mild cognitive impairment and actually had improved. She called my office on June 28 and we increased her Celexa from 10-20 mg. Since that time she has continued to have difficulty with sleep and is also notes some slight leg cramping. Prior to the last 2 months, she would sleep without difficulty.   Review of Systems     Objective:   Physical Exam Alert and in no distress but quite tearful.       Assessment & Plan:  Dysthymia  Sleep disturbance She will continue on her Celexa at 20 mg. I will give her doxepin to try and help with sleep. Recommend she use this for roughly 1 week to help break the cycle of lack of sleep. Also talked briefly about sleep hygiene with her. Specifically in 2 right down her thoughts prior to going to bed and do not watch TV or read a book. Turn here in one month or call sooner if difficulty.

## 2015-08-31 NOTE — Telephone Encounter (Signed)
Review with [name] (Dr. [employer] assistant) recommended OV to discuss concern. Called [name] and schedule pt for 3:15 pm this afternoon. Stacey Wells

## 2015-08-31 NOTE — Patient Instructions (Signed)
8 the sleeping medicine nightly for the next week and then see how you do after that. Will still have a left. Stay on the Celexa

## 2015-09-14 ENCOUNTER — Encounter: Payer: PPO | Admitting: Family Medicine

## 2015-09-15 DIAGNOSIS — M5136 Other intervertebral disc degeneration, lumbar region: Secondary | ICD-10-CM | POA: Diagnosis not present

## 2015-09-15 DIAGNOSIS — M47816 Spondylosis without myelopathy or radiculopathy, lumbar region: Secondary | ICD-10-CM | POA: Diagnosis not present

## 2015-09-15 DIAGNOSIS — M545 Low back pain: Secondary | ICD-10-CM | POA: Diagnosis not present

## 2015-09-15 DIAGNOSIS — M9903 Segmental and somatic dysfunction of lumbar region: Secondary | ICD-10-CM | POA: Diagnosis not present

## 2015-09-17 DIAGNOSIS — M47816 Spondylosis without myelopathy or radiculopathy, lumbar region: Secondary | ICD-10-CM | POA: Diagnosis not present

## 2015-09-17 DIAGNOSIS — M545 Low back pain: Secondary | ICD-10-CM | POA: Diagnosis not present

## 2015-09-17 DIAGNOSIS — M5136 Other intervertebral disc degeneration, lumbar region: Secondary | ICD-10-CM | POA: Diagnosis not present

## 2015-09-17 DIAGNOSIS — M9903 Segmental and somatic dysfunction of lumbar region: Secondary | ICD-10-CM | POA: Diagnosis not present

## 2015-09-27 ENCOUNTER — Other Ambulatory Visit: Payer: Self-pay | Admitting: Nurse Practitioner

## 2015-09-30 ENCOUNTER — Other Ambulatory Visit: Payer: Self-pay | Admitting: *Deleted

## 2015-09-30 MED ORDER — MEMANTINE HCL ER 28 MG PO CP24: ORAL_CAPSULE | ORAL | 1 refills | Status: DC

## 2015-10-01 ENCOUNTER — Encounter: Payer: Self-pay | Admitting: Family Medicine

## 2015-10-01 ENCOUNTER — Ambulatory Visit (INDEPENDENT_AMBULATORY_CARE_PROVIDER_SITE_OTHER): Payer: PPO | Admitting: Family Medicine

## 2015-10-01 VITALS — BP 140/90 | HR 80 | Ht 66.0 in | Wt 212.0 lb

## 2015-10-01 DIAGNOSIS — Z8669 Personal history of other diseases of the nervous system and sense organs: Secondary | ICD-10-CM | POA: Diagnosis not present

## 2015-10-01 DIAGNOSIS — G479 Sleep disorder, unspecified: Secondary | ICD-10-CM | POA: Diagnosis not present

## 2015-10-01 DIAGNOSIS — F341 Dysthymic disorder: Secondary | ICD-10-CM | POA: Diagnosis not present

## 2015-10-01 DIAGNOSIS — G6189 Other inflammatory polyneuropathies: Secondary | ICD-10-CM

## 2015-10-01 MED ORDER — AMITRIPTYLINE HCL 10 MG PO TABS: 10.0000 mg | ORAL_TABLET | Freq: Every day | ORAL | 1 refills | Status: DC

## 2015-10-01 NOTE — Progress Notes (Signed)
   Subjective:    Patient ID: Stacey Wells, female    DOB: Jan 21, 1941, 75 y.o.   MRN: [id number]  HPI She is here for follow-up visit. She did use the sleep med however when she was on that she was told by the pharmacist to stop the Celexa which she did. She slept well on the medication however since stopping it she states that her burning sensation in the feet has kept her from sleeping. Further discussion with her indicates that she described as to me before is more of a cramping sensation but now states it is burning and has been going on for several months. She has a previous history of migraine headache had not had one in several years. She tapered off amitriptyline and approximately one month later started noticing a burning sensation in her feet and this has continued. There is no history of diabetes.   Review of Systems     Objective:   Physical Exam Alert and in no distress. Exam of her feet shows normal sensation and pulses as well as skin.       Assessment & Plan:  Dysthymia  Sleep disturbance  Other inflammatory polyneuropathies (HCC) - Plan: amitriptyline (ELAVIL) 10 MG tablet  History of migraine headaches She will continue on the Celexa. With her history of responding nicely to amitriptyline and having the burning sensation occur proximal one month after finishing this, it could be that she actually has had a neuropathy for longer period of time that was being treated with the amitriptyline. I will start her back on 10 mg of amitriptyline, have her continue on the Celexa and she is to call me in 2 weeks to let me know how she is doing.

## 2015-10-01 NOTE — Patient Instructions (Addendum)
Stay on the 20 mg of Celexa and take the amitriptyline at night. Call me in several weeks

## 2015-10-13 DIAGNOSIS — M545 Low back pain: Secondary | ICD-10-CM | POA: Diagnosis not present

## 2015-10-13 DIAGNOSIS — M47816 Spondylosis without myelopathy or radiculopathy, lumbar region: Secondary | ICD-10-CM | POA: Diagnosis not present

## 2015-10-13 DIAGNOSIS — M9903 Segmental and somatic dysfunction of lumbar region: Secondary | ICD-10-CM | POA: Diagnosis not present

## 2015-10-13 DIAGNOSIS — M5136 Other intervertebral disc degeneration, lumbar region: Secondary | ICD-10-CM | POA: Diagnosis not present

## 2015-10-27 ENCOUNTER — Telehealth: Payer: Self-pay | Admitting: Family Medicine

## 2015-10-27 NOTE — Telephone Encounter (Signed)
Pt informed and verbalized understanding

## 2015-10-27 NOTE — Telephone Encounter (Signed)
Pt called and stated that her symptoms are better but she is still having issues. She thinks that the dose of amitriptyline needs to be increased. Pt can be reached at [phone] and uses [facility] on [address].

## 2015-10-27 NOTE — Telephone Encounter (Signed)
Have her increase the amitriptyline to two pills and call me in roughly a week.

## 2015-11-04 ENCOUNTER — Telehealth: Payer: Self-pay | Admitting: Family Medicine

## 2015-11-04 NOTE — Telephone Encounter (Signed)
She takes it for neuropathy

## 2015-11-04 NOTE — Telephone Encounter (Signed)
What condition is she taking the amitriptyline for that its not helping, sleep?  burning pains in feet?  Headaches?   This drug can be used for several things but I wanted to be sure the reason she was taking it and what else she has used prior for this condition.

## 2015-11-04 NOTE — Telephone Encounter (Signed)
Pt was told to call back if she was still having problems with taking the 20 mg of amitriptyline, states she is not getting any better, she is going out of town tomorrow for a wedding and would like something done today, please advise pt, informed pt you was out of town. Pt can be reached at [phone] and pt uses [facility] [place], [place] - [address] AT [place]

## 2015-11-05 NOTE — Telephone Encounter (Signed)
Audelia Acton talked with pt yesterday afternoon

## 2015-11-05 NOTE — Telephone Encounter (Signed)
I called and spoke to patient.   She is leaving out of town today.  She notes only being on amitriptyline prior for neuropathy. Has just been back on 20mg  the last month but its not helping.   She was on lower doses prior.    When she gets back into town next week she will call back and we will likely have her wean off amitriptyline and begin gabapentin.

## 2015-11-06 NOTE — Telephone Encounter (Signed)
Increase to 25mg  nightly

## 2015-12-18 ENCOUNTER — Other Ambulatory Visit: Payer: Self-pay | Admitting: Medical

## 2015-12-18 ENCOUNTER — Telehealth: Payer: Self-pay | Admitting: Family Medicine

## 2015-12-18 ENCOUNTER — Other Ambulatory Visit: Payer: Self-pay | Admitting: Family Medicine

## 2015-12-18 MED ORDER — CITALOPRAM HYDROBROMIDE 20 MG PO TABS: 20.0000 mg | ORAL_TABLET | Freq: Every day | ORAL | 0 refills | Status: DC

## 2015-12-18 NOTE — Telephone Encounter (Signed)
Is this okay to refill? 

## 2015-12-18 NOTE — Telephone Encounter (Signed)
Rcvd 90 day refill request for Citalopram 20mg 

## 2015-12-22 DIAGNOSIS — M542 Cervicalgia: Secondary | ICD-10-CM | POA: Diagnosis not present

## 2015-12-22 DIAGNOSIS — M47812 Spondylosis without myelopathy or radiculopathy, cervical region: Secondary | ICD-10-CM | POA: Diagnosis not present

## 2015-12-22 DIAGNOSIS — M5030 Other cervical disc degeneration, unspecified cervical region: Secondary | ICD-10-CM | POA: Diagnosis not present

## 2015-12-22 DIAGNOSIS — M9901 Segmental and somatic dysfunction of cervical region: Secondary | ICD-10-CM | POA: Diagnosis not present

## 2016-01-12 ENCOUNTER — Ambulatory Visit (INDEPENDENT_AMBULATORY_CARE_PROVIDER_SITE_OTHER): Payer: PPO | Admitting: Family Medicine

## 2016-01-12 VITALS — BP 130/90 | HR 84 | Wt 215.0 lb

## 2016-01-12 DIAGNOSIS — R42 Dizziness and giddiness: Secondary | ICD-10-CM | POA: Diagnosis not present

## 2016-01-12 DIAGNOSIS — G609 Hereditary and idiopathic neuropathy, unspecified: Secondary | ICD-10-CM

## 2016-01-12 MED ORDER — AMITRIPTYLINE HCL 50 MG PO TABS: 50.0000 mg | ORAL_TABLET | Freq: Every day | ORAL | 3 refills | Status: DC

## 2016-01-12 NOTE — Progress Notes (Signed)
   Subjective:    Patient ID: MONCHEL ATALLA, female    DOB: 1940-08-14, 75 y.o.   MRN: [id number]  HPI She is here for consult concerning her peripheral neuropathy. In the past she was on amitriptyline mainly for her headaches and had no difficulty with neuropathic symptoms. She then stopped amitriptyline due to not needing it for the headaches but noted within a month difficulty with breathing and pain in her feet. She has been slowly increasing her amitriptyline and it 25 mg is still not under good control. She also has had 2 episodes of dizziness. One was while she was still in bed and it went away fairly quickly. She did have one episode of vomiting with that. She had another one when she sat up in bed however the symptoms went away relatively quickly.   Review of Systems     Objective:   Physical Exam Alert and in no distress. Cardiac exam shows regular rhythm without murmurs or gallops. Lungs are clear to auscultation. Blood pressure is recorded.       Assessment & Plan:  Idiopathic peripheral neuropathy - Plan: amitriptyline (ELAVIL) 50 MG tablet  Dizziness I will increase her amitriptyline up to 50 mg which is what she was on in the past. Discussed the dizziness with her and explained that the 2 episodes of dizziness weren't necessarily related to each other. Did strongly encourage her to get out of bed more slowly to eliminate any postural dizziness.

## 2016-01-19 DIAGNOSIS — M9901 Segmental and somatic dysfunction of cervical region: Secondary | ICD-10-CM | POA: Diagnosis not present

## 2016-01-19 DIAGNOSIS — M47812 Spondylosis without myelopathy or radiculopathy, cervical region: Secondary | ICD-10-CM | POA: Diagnosis not present

## 2016-01-19 DIAGNOSIS — M5030 Other cervical disc degeneration, unspecified cervical region: Secondary | ICD-10-CM | POA: Diagnosis not present

## 2016-01-19 DIAGNOSIS — M542 Cervicalgia: Secondary | ICD-10-CM | POA: Diagnosis not present

## 2016-02-02 ENCOUNTER — Encounter: Payer: Self-pay | Admitting: Neurology

## 2016-02-02 ENCOUNTER — Ambulatory Visit (INDEPENDENT_AMBULATORY_CARE_PROVIDER_SITE_OTHER): Payer: PPO | Admitting: Neurology

## 2016-02-02 VITALS — BP 136/78 | HR 76 | Resp 16 | Ht 66.0 in | Wt 215.0 lb

## 2016-02-02 DIAGNOSIS — T466X5A Adverse effect of antihyperlipidemic and antiarteriosclerotic drugs, initial encounter: Principal | ICD-10-CM

## 2016-02-02 DIAGNOSIS — G3184 Mild cognitive impairment, so stated: Secondary | ICD-10-CM

## 2016-02-02 DIAGNOSIS — M609 Myositis, unspecified: Secondary | ICD-10-CM | POA: Diagnosis not present

## 2016-02-02 NOTE — Patient Instructions (Signed)

## 2016-02-02 NOTE — Progress Notes (Signed)
[facility]  PATIENT: Stacey Wells Wells DOB: February 18, 1941   REASON FOR VISIT: Follow-up for mild cognitive impairment and gait abnormality HISTORY FROM: Patient and husband Stacey Wells Wells    HISTORY OF PRESENT ILLNESS: HISTORY: CDMrs. Wells , a married , right handed caucasian patient from [place] reports today that she has continued to fall frequently. Apparently she was seen by Dr. Janann Colonel for the same concern in addition at his visit with him she had mentioned a cognitive concern. After being evaluated by the movement disorder specialist, she was diagnosed with Parkinson's syndrome, which terrifies her, of course.  She was also sent for an MRI of the brain which returned normal for age with some chronic microvascular ischemia that was only mild. The patient has an interesting story to her frequent falls she seems sometimes to have a warning that she knows she may follow but some of her falls occur out of the blue and very sudden. She has suffered injuries and she is visibly bruised for example on the lateral epicondyle of the right elbow, she describes large hematomata on her buttocks and on the abdomen left more than right. The vertebral fracture in the past. She has only once hit her head during a fall but at that time hit the chest of drawers with a hard surface. One time she fell outside and hit the back of her head on the grass, but she had trouble to get back up. The falls appear independent of time I'm of day, or surrounding, of proximity to mealtimes, of fluid intake, this seems to be no special activity that may trigger it.  They can occur as the patient stands or sits or walks. Independent of the falls the patient has had some episodes of sudden weakness, one of them she describes as feeling a wave of heat coming over her she was extremely hot. She describes that she slid from a chair onto the bed and then couldn't find the strength to get up again and she was confused about  her surroundings. It may have taken her several minutes to reorient herself to the surroundings. She has also started to use night lights as she feels more disoriented in the dark. She was referred for cardiac monitoring and this resulted in normal tracing.   She had a normal EEG at this office.  She reports ongoing confusion, getting lost in a familiar environment and gets parts of conversations. Her husband has noted that there is a cognitive problem and he has mentioned is here today. My nurse Stacey Wells Wells performed a Montral cognitive assessment test today with the patient. He was able to name 3 animals, she was perfectly fine in drawing a clock face and placing the hands of the clock at 11:10. She had trouble copying a every image and the Trail Making Test gave her difficulties. Her attention was excellent and she was able to repeat a list of digits without problem. She performed 2 out of 5 subtractions correctly, but she did not word for word repeat the sentences given to her. MOCA was was tested and the patient was able minutes several minutes after the initial test was performed to recall 4 out of 5 words. She appears anxious, afraid to fail. 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 No known strokes or TIAs. Per patient she has a lumbar fracture, she is followed by ortho for this and is wearing a brace. She is taking tramadol as needed for pain.   CM Stacey Wells  Wells, 75 year old female returns for follow-up. She was last seen 02/04/15.  She was placed on Aricept in March 2016 but had vomiting from the medication. She was then placed on Namenda extended release and titrated slowly. The patient feels [name] has been beneficial and she feels better. She did have a PT evaluation but the patient does not feel like physical therapy was beneficial for her. She denies any falls since last seen . She has not had any falls since her statin drugs were discontinued. She continues to do her  household duties without difficulty, cooking cleaning etc., she also has a garden.  She returns for reevaluation.   Interval history from 02/02/2016, Stacey Wells Wells is seen here today with a much more stable gait, more alert and not in pain or discomfort. She describes home ultimate medications were discontinued by her primary care physician's which all seemed to have contributed to orthostatic dizziness, intractable coughing, myalgia and falling with muscle weakness. I have listed these in detail below. I have followed up today to see if her memory complaint has progressed. A Montral cognitive assessment was performed in the office by my nurse, Stacey Wells Parody, RN. She scored 26 out of 30 points which is considered normal for age and education. She did not Stacey Wells any of the delayed recall words which are usually the first indicator of short-term memory loss.    REVIEW OF SYSTEMS: Full 14 system review of systems performed and notable only for those listed, all others are neg:    ALLERGIES: Allergies  Allergen Reactions  . Aricept [Donepezil Hcl] Nausea And Vomiting    vomiting  . Prozac [Fluoxetine Hcl] Other (See Comments)    hallucinations  . Statins Other (See Comments)    Dizziness and lightheadedness.  . Sulfamethoxazole     HOME MEDICATIONS: Outpatient Medications Prior to Visit  Medication Sig Dispense Refill  . amitriptyline (ELAVIL) 50 MG tablet Take 1 tablet (50 mg total) by mouth at bedtime. 90 tablet 3  . citalopram (CELEXA) 20 MG tablet Take 1 tablet (20 mg total) by mouth daily. 90 tablet 0  . memantine (NAMENDA XR) 28 MG CP24 24 hr capsule TAKE 1 CAPSULE(28 MG) BY MOUTH DAILY 90 capsule 1  . nebivolol (BYSTOLIC) 5 MG tablet Take 1 tablet (5 mg total) by mouth daily. 90 tablet 3  . Doxepin HCl 3 MG TABS Take 1 tablet (3 mg total) by mouth at bedtime as needed. (Patient not taking: Reported on 01/12/2016) 15 tablet 0   No facility-administered medications prior to visit.      PAST MEDICAL HISTORY: Past Medical History:  Diagnosis Date  . Anxiety   . Arthritis   . Cancer ([place]) 09/1999   BREAST  . Dementia    on Namenda which has caused good improvement  . 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 Dyslipidemia   . Dysthymia   . Frequent falls    pt states she falls frequently  . Glucose intolerance (impaired glucose tolerance)   . Hypertension   . Migraine headache    none since menopause  . PONV (postoperative nausea and vomiting)    has had with all surgeries  . Renal insufficiency   . Stroke [facility]) 2015   Pt states was told she had a "mild stroke", no deficits  . Syncope Sept, 2016   did not seek medical care  . Trigger ring finger of right hand 12/19/2014    PAST SURGICAL HISTORY: Past Surgical  History:  Procedure Laterality Date  . BREAST LUMPECTOMY Right 2001  . DILATION AND CURETTAGE OF UTERUS    . ELBOW SURGERY Right   . JOINT REPLACEMENT    . REPLACEMENT TOTAL KNEE Bilateral 2002/2003  . TRIGGER FINGER RELEASE Right 12/19/2014   Procedure: RELEASE RIGHT RING TRIGGER FINGER/A-1 PULLEY;  Surgeon: Marchia Bond, MD;  Location: [facility];  Service: Orthopedics;  Laterality: Right;    FAMILY HISTORY: No family history on file.  SOCIAL HISTORY: Social History   Social History  . Marital status: Married    Spouse name: N/A  . Number of children: N/A  . Years of education: HS   Occupational History  . Not on file.   Social History Main Topics  . Smoking status: Never Smoker  . Smokeless tobacco: Never Used  . Alcohol use 0.0 oz/week     Comment: daily glass of wine with dinner  . Drug use: No  . Sexual activity: Not Currently   Other Topics Concern  . Not on file   Social History Narrative   Married, 4 children   Right handed   12 th grade   1 cup daily     PHYSICAL EXAM  Vitals:   02/02/16 1015  BP: 136/78  Pulse: 76  Resp: 16  Weight: 215 lb (97.5 kg)  Height: 5\' 6"  (1.676  m)   Body mass index is 34.7 kg/m. General: The patient is awake, alert and appears not in acute distress. The patient is well groomed. Head: Normocephalic, atraumatic.  Neck is supple. No bruit Cardiovascular: Regular rate and rhythm , without murmurs  Skin: Without evidence of Rash,  Neurologic exam :The patient is awake and alert, oriented to place and time.  Montreal Cognitive Assessment  02/02/2016 08/04/2015 02/04/2015  Visuospatial/ Executive (0/5) 4 5 5   Naming (0/3) 3 3 3   Attention: Read list of digits (0/2) 2 2 1   Attention: Read list of letters (0/1) 1 1 1   Attention: Serial 7 subtraction starting at 100 (0/3) 0 3 3  Language: Repeat phrase (0/2) 2 2 0  Language : Fluency (0/1) 1 1 1   Abstraction (0/2) 2 2 0  Delayed Recall (0/5) 5 4 4   Orientation (0/6) 6 6 5   Total 26 29 23   Adjusted Score (based on education) 26 - -      Speech is fluent with mild dysphonia. Mood and affect are appropriate. Cranial nerves:Pupils are equal and briskly reactive to light.. Extraocular movements in vertical and horizontal planes intact and without nystagmus. Visual fields by finger perimetry are intact.Hearing to finger rub intact. Facial sensation intact to fine touch.  Facial motor strength is symmetric and tongue and uvula move midline. Motor exam: Normal tone and normal muscle bulk and symmetric normal strength in all extremities No cogwheel rigidity nor spasticity. No resting tremor . Loss of grip strength seen in previous visit now recovered !  Sensory: Fine touch, pinprick and vibration were not intact. She lost vibratory sense in both feet, all toes.   Coordination: Rapid alternating movements in the fingers/hands is tested and normal.Finger-to-nose maneuver tested and normal without evidence of ataxia, dysmetria or tremor. Gait and station: Patient walks without assistive device.  Stance is stable and normal.  The patient has a normal arm swing when she walks, she  could turn with 3-4 steps .   DIAGNOSTIC DATA (LABS, IMAGING, TESTING) - I reviewed patient records, labs, notes, testing and imaging myself where available.  Lab Results  Component Value Date   WBC 5.5 07/23/2015   HGB 14.4 07/23/2015   HCT 42.7 07/23/2015   MCV 92.0 07/23/2015   PLT 180 07/23/2015      Component Value Date/Time   NA 139 07/23/2015 1333   K 4.5 07/23/2015 1333   CL 103 07/23/2015 1333   CO2 24 07/23/2015 1333   GLUCOSE 86 07/23/2015 1333   BUN 23 07/23/2015 1333   CREATININE 1.25 (H) 07/23/2015 1333   CALCIUM 9.3 07/23/2015 1333   PROT 6.9 07/23/2015 1333   PROT 6.9 07/01/2013 1404   ALBUMIN 4.2 07/23/2015 1333   AST 27 07/23/2015 1333   ALT 17 07/23/2015 1333   ALKPHOS 67 07/23/2015 1333   BILITOT 0.6 07/23/2015 1333     ASSESSMENT AND PLAN  35 minute work up by MD for MCI and gait disorder.   75y.o. year old female, who has a medical history of mild cognitive impairment with improvement in her MOCA score since being placed on Namenda extended release.  She vomited on Aricept.  aricept wasr discontinued.  Statin  Induced muscle wekness and myalgia let to d/c of statins. She has now a stable gait pattern.  HTN medications let to caughing and dizziness, d/c allowed for recovery from coughing and dizziness. Elavil was d/c but her neuropathy  Resurfaced. Insomnia was bad. She is now again and  better controlled on 50 mg at night.   She did have physical therapy evaluation for her frequent falls however she canceled the rest of the appointments.She has not fallen since her statin drugs were discontinued  MOCA score is stable 29/30 Continue Namenda 28 mg extended release daily last refilled by Dr. Demetrios Isaacs. Follow-up in 12 months or prn next with me,   Larey Seat, MD     [facility] [address], [place], [place] [address] [phone]

## 2016-02-17 DIAGNOSIS — M9901 Segmental and somatic dysfunction of cervical region: Secondary | ICD-10-CM | POA: Diagnosis not present

## 2016-02-17 DIAGNOSIS — M47812 Spondylosis without myelopathy or radiculopathy, cervical region: Secondary | ICD-10-CM | POA: Diagnosis not present

## 2016-02-17 DIAGNOSIS — M542 Cervicalgia: Secondary | ICD-10-CM | POA: Diagnosis not present

## 2016-02-17 DIAGNOSIS — M5030 Other cervical disc degeneration, unspecified cervical region: Secondary | ICD-10-CM | POA: Diagnosis not present

## 2016-03-15 DIAGNOSIS — M542 Cervicalgia: Secondary | ICD-10-CM | POA: Diagnosis not present

## 2016-03-15 DIAGNOSIS — M9901 Segmental and somatic dysfunction of cervical region: Secondary | ICD-10-CM | POA: Diagnosis not present

## 2016-03-15 DIAGNOSIS — M47812 Spondylosis without myelopathy or radiculopathy, cervical region: Secondary | ICD-10-CM | POA: Diagnosis not present

## 2016-03-15 DIAGNOSIS — M5030 Other cervical disc degeneration, unspecified cervical region: Secondary | ICD-10-CM | POA: Diagnosis not present

## 2016-04-12 DIAGNOSIS — M5030 Other cervical disc degeneration, unspecified cervical region: Secondary | ICD-10-CM | POA: Diagnosis not present

## 2016-04-12 DIAGNOSIS — M47812 Spondylosis without myelopathy or radiculopathy, cervical region: Secondary | ICD-10-CM | POA: Diagnosis not present

## 2016-04-12 DIAGNOSIS — M9901 Segmental and somatic dysfunction of cervical region: Secondary | ICD-10-CM | POA: Diagnosis not present

## 2016-04-12 DIAGNOSIS — M542 Cervicalgia: Secondary | ICD-10-CM | POA: Diagnosis not present

## 2016-04-23 ENCOUNTER — Other Ambulatory Visit: Payer: Self-pay | Admitting: Neurology

## 2016-05-10 DIAGNOSIS — M545 Low back pain: Secondary | ICD-10-CM | POA: Diagnosis not present

## 2016-05-10 DIAGNOSIS — M47816 Spondylosis without myelopathy or radiculopathy, lumbar region: Secondary | ICD-10-CM | POA: Diagnosis not present

## 2016-05-10 DIAGNOSIS — M5136 Other intervertebral disc degeneration, lumbar region: Secondary | ICD-10-CM | POA: Diagnosis not present

## 2016-05-10 DIAGNOSIS — M9903 Segmental and somatic dysfunction of lumbar region: Secondary | ICD-10-CM | POA: Diagnosis not present

## 2016-06-09 DIAGNOSIS — M5136 Other intervertebral disc degeneration, lumbar region: Secondary | ICD-10-CM | POA: Diagnosis not present

## 2016-06-09 DIAGNOSIS — M9903 Segmental and somatic dysfunction of lumbar region: Secondary | ICD-10-CM | POA: Diagnosis not present

## 2016-06-09 DIAGNOSIS — M47816 Spondylosis without myelopathy or radiculopathy, lumbar region: Secondary | ICD-10-CM | POA: Diagnosis not present

## 2016-06-09 DIAGNOSIS — M545 Low back pain: Secondary | ICD-10-CM | POA: Diagnosis not present

## 2016-06-16 ENCOUNTER — Other Ambulatory Visit: Payer: Self-pay | Admitting: Family Medicine

## 2016-06-16 NOTE — Telephone Encounter (Signed)
Is this okay to refill? 

## 2016-07-04 ENCOUNTER — Telehealth: Payer: Self-pay | Admitting: Neurology

## 2016-07-04 NOTE — Addendum Note (Signed)
Addended by: Larey Seat on: 07/04/2016 04:50 PM   Modules accepted: Orders

## 2016-07-04 NOTE — Telephone Encounter (Signed)
I spoke with Mrs. Stacey Wells, who confirmed the side effects as described in the phone note as well as the resolution once she stopped taking Namenda Rx. I've asked her to discontinue the medication as she feels better now.

## 2016-07-04 NOTE — Telephone Encounter (Signed)
Patient called office in reference to NAMENDA XR 28 MG CP24 24 hr capsule.  Patient states she was unable to walk well and having back pain.  Patient stopped taking Namenda XR 10 days ago back pain has resolved, no more sweating.  Patient feels great mind it clear.  Please call

## 2016-07-19 NOTE — Telephone Encounter (Signed)
I called pt. She has recently fallen out of bed and injured her jaw. She reports that Dr. Brett Fairy did call her back 2 weeks ago but did not put her on another medication after telling her to stop the namenda, and thinks that perhaps she does need another medication. I advised pt that with the falls and change in medication request, she should be evaluated. An appt was made with pt for 07/21/2016 at 8:30am. Pt verbalized understanding of this recommendation and of new appt date and time.

## 2016-07-19 NOTE — Telephone Encounter (Signed)
Pt calling to inform that she is now falling again and is beginning to drop things again.  She does not want to go back on the Namenda but would like something to clear her mind.

## 2016-07-21 ENCOUNTER — Ambulatory Visit (INDEPENDENT_AMBULATORY_CARE_PROVIDER_SITE_OTHER): Payer: PPO | Admitting: Neurology

## 2016-07-21 ENCOUNTER — Encounter: Payer: Self-pay | Admitting: Neurology

## 2016-07-21 VITALS — BP 159/93 | HR 90 | Resp 20 | Ht 66.0 in | Wt 216.0 lb

## 2016-07-21 DIAGNOSIS — F0781 Postconcussional syndrome: Secondary | ICD-10-CM | POA: Diagnosis not present

## 2016-07-21 DIAGNOSIS — W19XXXD Unspecified fall, subsequent encounter: Secondary | ICD-10-CM

## 2016-07-21 DIAGNOSIS — R269 Unspecified abnormalities of gait and mobility: Secondary | ICD-10-CM | POA: Diagnosis not present

## 2016-07-21 MED ORDER — MEMANTINE HCL 10 MG PO TABS: 10.0000 mg | ORAL_TABLET | Freq: Two times a day (BID) | ORAL | 5 refills | Status: DC

## 2016-07-21 NOTE — Progress Notes (Signed)
[facility]  PATIENT: Stacey Wells DOB: Oct 18, 1940   REASON FOR VISIT: Follow-up for mild cognitive impairment and gait abnormality HISTORY FROM: Patient and husband Stacey Wells    HISTORY OF PRESENT ILLNESS: HISTORY: CD Stacey Wells , a married and right handed caucasian patient from [place] reports  she has continued to fall frequently. Apparently she was seen by Dr. Janann Colonel for the same concern in addition at his visit with him she had mentioned a cognitive concern.After being evaluated by the movement disorder specialist, she was diagnosed with Parkinson's syndrome, which terrifies her, of course. She was also sent for an MRI of the brain which returned normal for age with some chronic microvascular ischemia that was only mild. The patient has an interesting story to her frequent falls she seems sometimes to have a warning that she knows she may follow but some of her falls occur out of the blue and very sudden. She has suffered injuries and she is visibly bruised for example on the lateral epicondyle of the right elbow, she describes large hematomata on her buttocks and on the abdomen left more than right. The vertebral fracture in the past. She has only once hit her head during a fall but at that time hit the chest of drawers with a hard surface. One time she fell outside and hit the back of her head on the grass, but she had trouble to get back up. The falls appear independent of time I'm of day, or surrounding, of proximity to mealtimes, of fluid intake, this seems to be no special activity that may trigger it.  They can occur as the patient stands or sits or walks. Independent of the falls the patient has had some episodes of sudden weakness, one of them she describes as feeling a wave of heat coming over her she was extremely hot. She describes that she slid from a chair onto the bed and then couldn't find the strength to get up again and she was confused about her  surroundings. It may have taken her several minutes to reorient herself to the surroundings. She has also started to use night lights as she feels more disoriented in the dark. She was referred for cardiac monitoring and this resulted in normal tracing.   She had a normal EEG at this office.  She reports ongoing confusion, getting lost in a familiar environment and gets parts of conversations. Her husband has noted that there is a cognitive problem and he has mentioned is here today. My nurse Stacey Wells performed a Montral cognitive assessment test today with the patient. He was able to name 3 animals, she was perfectly fine in drawing a clock face and placing the hands of the clock at 11:10. She had trouble copying a every image and the Trail Making Test gave her difficulties. Her attention was excellent and she was able to repeat a list of digits without problem. She performed 2 out of 5 subtractions correctly, but she did not word for word repeat the sentences given to her. MOCA was was tested and the patient was able minutes several minutes after the initial test was performed to recall 4 out of 5 words. She appears anxious, afraid to fail. 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 No known strokes or TIAs. Per patient she has a lumbar fracture, she is followed by ortho for this and is wearing a brace. She is taking tramadol as needed for pain.   CM : Stacey Wells,  76 year old female returns for follow-up. She was last seen 02/04/15.  She was placed on Aricept in March 2016 but had vomiting from the medication. She was then placed on Namenda extended release and titrated slowly.The patient feels Stacey Wells has been beneficial and she feels better. She did have a PT evaluation but the patient does not feel like physical therapy was beneficial for her. She denies any falls since last seen . She has not had any falls since her statin drugs were discontinued. She continues to do her household  duties without difficulty, cooking cleaning etc., she also has a garden.  She returns for reevaluation.   Interval history from 02/02/2016, Stacey Wells is seen here today with a much more stable gait, more alert and not in pain or discomfort. She describes home ultimate medications were discontinued by her primary care physician's which all seemed to have contributed to orthostatic dizziness, intractable coughing, myalgia and falling with muscle weakness. I have listed these in detail below. I have followed up today to see if her memory complaint has progressed. A Montral cognitive assessment was performed in the office by my nurse, Stacey Wells. She scored 26 out of 30 points which is considered normal for age and education. She did not Stacey any of the delayed recall words which are usually the first indicator of short-term memory loss.  CD Interval history from 07/21/2016. I have the pleasure of seeing Stacey Wells today. Stacey Wells reports that she had experienced hot flushes and back pain but on Namenda XR, a once a daily 28 mg dose of this medication. The symptoms resolved when she discontinued the medication. She also did not longer experience flushing and diaphoresis. She had constant hot flashes while on the medication. Unfortunately her gait instability has returned which seemed to have resolved after statins were discontinued. She had 4 near falls just in the last week. While neither on Namenda nor Aricept she has noticed a decline in her cognitive function and today's Montral cognitive assessment places her for the first time ever in a category that could correspond to early dementia. Mild cognitive impairment patients have a risk of 7% per year to convert to Alzheimer's disease. We discussed today if he should try a lower dose of Namenda which seemed not to have created the back problems and hopefully can help her to stabilize. In addition we will pay special attention to her gait  disorder today. The patient's blood pressure was elevated at today's check in time    REVIEW OF SYSTEMS: Full 14 system review of systems performed and notable only for those listed, all others are neg:  Near falls, hoarse voice, sleeping better on elavil, no back pain but gait problems.    ALLERGIES: Allergies  Allergen Reactions  . Aricept [Donepezil Hcl] Nausea And Vomiting    vomiting  . Prozac [Fluoxetine Hcl] Other (See Comments)    hallucinations  . Statins Other (See Comments)    Dizziness and lightheadedness.  . Sulfamethoxazole     HOME MEDICATIONS: Outpatient Medications Prior to Visit  Medication Sig Dispense Refill  . amitriptyline (ELAVIL) 50 MG tablet Take 1 tablet (50 mg total) by mouth at bedtime. 90 tablet 3  . citalopram (CELEXA) 20 MG tablet Take 1 tablet (20 mg total) by mouth daily. 90 tablet 0  . nebivolol (BYSTOLIC) 5 MG tablet Take 1 tablet (5 mg total) by mouth daily. 90 tablet 3  . citalopram (CELEXA) 20 MG tablet TAKE 1 TABLET BY  MOUTH EVERY DAY 90 tablet 0   No facility-administered medications prior to visit.     PAST MEDICAL HISTORY: Past Medical History:  Diagnosis Date  . Anxiety   . Arthritis   . Cancer ([place]) 09/1999   BREAST  . Dementia    on Namenda which has caused good improvement  . 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 Dyslipidemia   . Dysthymia   . Frequent falls    pt states she falls frequently  . Glucose intolerance (impaired glucose tolerance)   . Hypertension   . Migraine headache    none since menopause  . PONV (postoperative nausea and vomiting)    has had with all surgeries  . Renal insufficiency   . Stroke [facility]) 2015   Pt states was told she had a "mild stroke", no deficits  . Syncope Sept, 2016   did not seek medical care  . Trigger ring finger of right hand 12/19/2014    PAST SURGICAL HISTORY: Past Surgical History:  Procedure Laterality Date  . BREAST LUMPECTOMY Right 2001  . DILATION  AND CURETTAGE OF UTERUS    . ELBOW SURGERY Right   . JOINT REPLACEMENT    . REPLACEMENT TOTAL KNEE Bilateral 2002/2003  . TRIGGER FINGER RELEASE Right 12/19/2014   Procedure: RELEASE RIGHT RING TRIGGER FINGER/A-1 PULLEY;  Surgeon: Marchia Bond, MD;  Location: [facility];  Service: Orthopedics;  Laterality: Right;    FAMILY HISTORY: No family history on file.   No dementia history - mother died at 44.  Father died of cancer, age 53  6 sisters and 2 brothers,  Both brothers have died and one sister, too. The patient is the youngest of none, her oldest sister is 24.   SOCIAL HISTORY: Social History   Social History  . Marital status: Married    Spouse name: N/A  . Number of children: N/A  . Years of education: HS   Occupational History  . Not on file.   Social History Main Topics  . Smoking status: Never Smoker  . Smokeless tobacco: Never Used  . Alcohol use 0.0 oz/week     Comment: daily glass of wine with dinner  . Drug use: No  . Sexual activity: Not Currently   Other Topics Concern  . Not on file   Social History Narrative   Married, 4 children   Right handed   12 th grade   1 cup daily     PHYSICAL EXAM  Vitals:   07/21/16 0835  BP: (!) 159/93  Pulse: 90  Resp: 20  Weight: 216 lb (98 kg)  Height: 5\' 6"  (1.676 m)   Body mass index is 34.86 kg/m. General: The patient is awake, alert and appears not in acute distress. The patient is well groomed. Erect posture.  speech his fluent. No anxiety or jitteriness.  Head: Normocephalic, atraumatic.  Neck is supple. No bruit, no murmur.  Cardiovascular: Regular rate and rhythm , without murmurs  Skin: Without evidence of Rash,  Neurologic exam :The patient is awake and alert, oriented to place and time.  Montreal Cognitive Assessment  07/21/2016 02/02/2016 08/04/2015 02/04/2015  Visuospatial/ Executive (0/5) 4 4 5 5   Naming (0/3) 3 3 3 3   Attention: Read list of digits (0/2) 0 2 2 1     Attention: Read list of letters (0/1) 1 1 1 1   Attention: Serial 7 subtraction starting at 100 (0/3) 0 0 3 3  Language: Repeat  phrase (0/2) 0 2 2 0  Language : Fluency (0/1) 1 1 1 1   Abstraction (0/2) 2 2 2  0  Delayed Recall (0/5) 4 5 4 4   Orientation (0/6) 6 6 6 5   Total 21 26 29 23   Adjusted Score (based on education) 21 26 - -    Speech is fluent with mild dysphonia. Mood and affect are appropriate. Cranial nerves: The patient reports stable sense of smell and taste, no reduction in sensory-   Pupils are equal and briskly reactive to light.. Extraocular movements in vertical and horizontal planes intact and without nystagmus. Visual fields by finger perimetry are intact.Hearing to finger rub intact. Facial sensation intact to fine touch.  Facial motor strength is symmetric and tongue and uvula move midline. Motor exam: Normal tone and normal muscle bulk and symmetric normal strength in all extremities Nocogwheel rigidity nor spasticity. No resting tremor or fasciculation . Loss of grip strength seen in previous visit now recovered !  Sensory: Fine touch, pinprick and vibration were not intact. She lost vibratory sense in both feet, all toes.   Coordination: Rapid alternating movements in the fingers/hands is tested and normal.Finger-to-nose maneuver tested and normal without evidence of ataxia, dysmetria or tremor. Gait and station: Patient walks without assistive device.  Stance is stable and normal.  The patient has a normal arm swing when she walks, she could turn with 3-4 steps .There was a slight eversion of the left foot as she walked but she could turn smoothly. I did not see ataxia, drift, retro-or propulsion.   DIAGNOSTIC DATA (LABS, IMAGING, TESTING) - I reviewed patient records, labs, notes, testing and imaging myself where available. Dr Redmond School will see the patient on July 6 this year. I'm looking forward to see if her kidney function, hypertension has been controlled  until then.   ASSESSMENT AND PLAN  35 minute work up by MD for MCI and gait disorder.    MCI -76y.o. year old female, who has a medical history of mild cognitive impairment with improvement in her MOCA score since being placed on Namenda extended release. She vomited on Aricept.  Aricept was discontinued. Namenda gave her back pain/ flank pain. Both medication not tolerated.   Gait -For while she felt that the gait instability has significantly improved but now she has again become more unstable. She had 3 near falls just at the last months or so several near falls in the last 4 days. Especially stairs are difficult for her to manage. Return to PT.   HTN medications let to caughing and dizziness, d/c allowed for recovery from coughing and dizziness. Elavil was d/c but her neuropathy resurfaced. Insomnia had increased - She is now again sleeping and  controlled on 50 mg at night.  Dr. Redmond School   MOCA score is stable 29/30 Follow-up in 12 months or prn next with me,   Larey Seat, MD     [facility] [address] [place], [place] [address] [phone]

## 2016-07-21 NOTE — Patient Instructions (Signed)
Post-Concussion Syndrome Post-concussion syndrome is the symptoms that can occur after a head injury. These symptoms can last from weeks to months. Follow these instructions at home:  Take medicines only as told by your doctor.  Do not take aspirin.  Sleep with your head raised to help with headaches.  Avoid activities that can cause another head injury. ? Do not play contact sports like football, hockey, soccer, or basketball. ? Do not do other risky activities like downhill skiing, martial arts, or horseback riding until your doctor says it is okay.  Keep all follow-up visits as told by your doctor. This is important. Contact a doctor if:  You have a harder time: ? Paying attention. ? Focusing. ? Remembering. ? Learning new information. ? Dealing with stress.  You need more time to complete tasks.  You are easily bothered (irritable).  You have more symptoms. Get help if you have any of these symptoms for more than two weeks after your injury:  Long-lasting (chronic) headaches.  Dizziness.  Trouble balancing.  Feeling sick to your stomach (nauseous).  Trouble with your vision.  Noise or light bothers you more.  Depression.  Mood swings.  Feeling worried (anxious).  Easily bothered.  Memory problems.  Trouble concentrating or paying attention.  Sleep problems.  Feeling tired all of the time.  Get help right away if:  You feel confused.  You feel very sleepy.  You are hard to wake up.  You feel sick to your stomach.  You keep throwing up (vomiting).  You feel like you are moving when you are not (vertigo).  Your eyes move back and forth very quickly.  You start shaking (convulsing) or pass out (faint).  You have very bad headaches that do not get better with medicine.  You cannot use your arms or legs like normal.  One of the black centers of your eyes (pupils) is bigger than the other.  You have clear or bloody fluid coming from your  nose or ears.  Your problems get worse, not better. This information is not intended to replace advice given to you by your health care provider. Make sure you discuss any questions you have with your health care provider. Document Released: 03/17/2004 Document Revised: 07/16/2015 Document Reviewed: 05/15/2013 Elsevier Interactive Patient Education  2018 Elsevier Inc.  

## 2016-07-25 ENCOUNTER — Other Ambulatory Visit: Payer: Self-pay | Admitting: Family Medicine

## 2016-07-25 DIAGNOSIS — I10 Essential (primary) hypertension: Secondary | ICD-10-CM

## 2016-07-25 NOTE — Telephone Encounter (Signed)
bystolic called to pharmacy- pt has Medicare Wellness scheduled 08/26/16. Victorino December

## 2016-08-16 DIAGNOSIS — M545 Low back pain: Secondary | ICD-10-CM | POA: Diagnosis not present

## 2016-08-16 DIAGNOSIS — M9903 Segmental and somatic dysfunction of lumbar region: Secondary | ICD-10-CM | POA: Diagnosis not present

## 2016-08-16 DIAGNOSIS — M5136 Other intervertebral disc degeneration, lumbar region: Secondary | ICD-10-CM | POA: Diagnosis not present

## 2016-08-16 DIAGNOSIS — M47816 Spondylosis without myelopathy or radiculopathy, lumbar region: Secondary | ICD-10-CM | POA: Diagnosis not present

## 2016-08-18 ENCOUNTER — Ambulatory Visit
Admission: RE | Admit: 2016-08-18 | Discharge: 2016-08-18 | Disposition: A | Discharge status: Home / Self Care | Payer: PPO | Source: Ambulatory Visit | Attending: Neurology | Admitting: Neurology

## 2016-08-18 DIAGNOSIS — F0781 Postconcussional syndrome: Secondary | ICD-10-CM

## 2016-08-18 DIAGNOSIS — R269 Unspecified abnormalities of gait and mobility: Secondary | ICD-10-CM

## 2016-08-18 DIAGNOSIS — R410 Disorientation, unspecified: Secondary | ICD-10-CM | POA: Diagnosis not present

## 2016-08-18 DIAGNOSIS — W19XXXD Unspecified fall, subsequent encounter: Secondary | ICD-10-CM

## 2016-08-18 MED ORDER — GADOBENATE DIMEGLUMINE 529 MG/ML IV SOLN
10.0000 mL | Freq: Once | INTRAVENOUS | Status: AC | PRN
Start: 2016-08-18 — End: 2016-08-18
  Administered 2016-08-18: 10 mL via INTRAVENOUS

## 2016-08-23 ENCOUNTER — Telehealth: Payer: Self-pay | Admitting: Neurology

## 2016-08-23 NOTE — Telephone Encounter (Signed)
-----   Message from Larey Seat, MD sent at 08/22/2016  5:31 PM EDT ----- This MRI has not changed since comparison study from 07-07-13 and again documented mild small vessel disease, no evidence of stroke, tumor  or scar formation. CD

## 2016-08-23 NOTE — Telephone Encounter (Signed)
Called pt to discuss MRI results. Explained to pt that the results were unchanged from previous MRI. Pt verbalized understanding and had no further questions at this time

## 2016-08-26 ENCOUNTER — Encounter: Payer: Self-pay | Admitting: Family Medicine

## 2016-08-26 ENCOUNTER — Ambulatory Visit (INDEPENDENT_AMBULATORY_CARE_PROVIDER_SITE_OTHER): Payer: PPO | Admitting: Family Medicine

## 2016-08-26 VITALS — BP 139/70 | HR 80 | Ht 65.0 in | Wt 215.8 lb

## 2016-08-26 DIAGNOSIS — G609 Hereditary and idiopathic neuropathy, unspecified: Secondary | ICD-10-CM | POA: Diagnosis not present

## 2016-08-26 DIAGNOSIS — Z853 Personal history of malignant neoplasm of breast: Secondary | ICD-10-CM | POA: Diagnosis not present

## 2016-08-26 DIAGNOSIS — E785 Hyperlipidemia, unspecified: Secondary | ICD-10-CM

## 2016-08-26 DIAGNOSIS — I10 Essential (primary) hypertension: Secondary | ICD-10-CM

## 2016-08-26 DIAGNOSIS — G3184 Mild cognitive impairment, so stated: Secondary | ICD-10-CM | POA: Diagnosis not present

## 2016-08-26 DIAGNOSIS — F341 Dysthymic disorder: Secondary | ICD-10-CM

## 2016-08-26 DIAGNOSIS — Z9181 History of falling: Secondary | ICD-10-CM

## 2016-08-26 LAB — LIPID PANEL
Cholesterol: 301 mg/dL — ABNORMAL HIGH (ref <200)
HDL: 59 mg/dL (ref >50)
LDL CALC: 210 mg/dL — AB (ref <100)
TRIGLYCERIDES: 159 mg/dL — AB (ref <150)
Total CHOL/HDL Ratio: 5.1 Ratio — ABNORMAL HIGH (ref <5.0)
VLDL: 32 mg/dL — ABNORMAL HIGH (ref <30)

## 2016-08-26 LAB — CBC WITH DIFFERENTIAL/PLATELET
BASOS ABS: 54 {cells}/uL (ref 0–200)
Basophils Relative: 1 %
EOS PCT: 2 %
Eosinophils Absolute: 108 cells/uL (ref 15–500)
HEMATOCRIT: 42.1 % (ref 35.0–45.0)
HEMOGLOBIN: 14 g/dL (ref 11.7–15.5)
LYMPHS ABS: 1890 {cells}/uL (ref 850–3900)
Lymphocytes Relative: 35 %
MCH: 30.5 pg (ref 27.0–33.0)
MCHC: 33.3 g/dL (ref 32.0–36.0)
MCV: 91.7 fL (ref 80.0–100.0)
MONO ABS: 432 {cells}/uL (ref 200–950)
MPV: 11.4 fL (ref 7.5–12.5)
Monocytes Relative: 8 %
NEUTROS ABS: 2916 {cells}/uL (ref 1500–7800)
NEUTROS PCT: 54 %
Platelets: 204 10*3/uL (ref 140–400)
RBC: 4.59 MIL/uL (ref 3.80–5.10)
RDW: 13.8 % (ref 11.0–15.0)
WBC: 5.4 10*3/uL (ref 4.0–10.5)

## 2016-08-26 LAB — COMPREHENSIVE METABOLIC PANEL
ALBUMIN: 4 g/dL (ref 3.6–5.1)
ALT: 13 U/L (ref 6–29)
AST: 21 U/L (ref 10–35)
Alkaline Phosphatase: 83 U/L (ref 33–130)
BILIRUBIN TOTAL: 0.5 mg/dL (ref 0.2–1.2)
BUN: 22 mg/dL (ref 7–25)
CALCIUM: 9.3 mg/dL (ref 8.6–10.4)
CO2: 21 mmol/L (ref 20–31)
CREATININE: 1.15 mg/dL — AB (ref 0.60–0.93)
Chloride: 103 mmol/L (ref 98–110)
Glucose, Bld: 85 mg/dL (ref 65–99)
Potassium: 4.7 mmol/L (ref 3.5–5.3)
SODIUM: 136 mmol/L (ref 135–146)
TOTAL PROTEIN: 7 g/dL (ref 6.1–8.1)

## 2016-08-26 NOTE — Progress Notes (Signed)
Stacey Wells is a 76 y.o. female who presents for annual wellness visit and follow-up on chronic medical conditions.  She has the following concerns: She was recently seen by neurology and seems to be quite stable on her present Namenda dosing. She is very comfortable with this. She also is back taking amitriptyline to help with her peripheral neuropathy. Her foot pain seems been a good control. She continues on site help pamper treatment of her underlying dysthymia and again seems be doing quite nicely on that. She and her husband are getting along quite well. She continues on Bystolic for her hypertension. She does have a history of hyperlipidemia and also statin intolerance. She does have a history of falls but since being placed on the Namenda this seems to be under much better control. There is also remote history of breast cancer dating back to 2001  Immunizations and Health Maintenance Immunization History  Administered Date(s) Administered  . Influenza Split 11/22/2011, 10/24/2012  . Influenza Whole 11/27/2007  . Influenza, High Dose Seasonal PF 11/21/2013, 11/17/2014  . Influenza-Unspecified 12/22/2015  . Pneumococcal Conjugate-13 11/17/2014  . Pneumococcal Polysaccharide-23 01/03/2005, 02/08/2013  . Zoster 02/21/2010   Health Maintenance Due  Topic Date Due  . TETANUS/TDAP  05/10/1957  . DEXA SCAN  05/11/2003    Last Pap smear: 3 years ago Last mammogram: 3 years ago  Last colonoscopy:never had one  Last DEXA: N/A Dentist: Done  Ophtho: Dr. Richardean Sale Exercise: yard work and active around the house   Other doctors caring for patient include: Dr. Brett Fairy  Advanced directives:living will   information given.  Depression screen:  See questionnaire below.  Depression screen [facility] 2/9 08/31/2015 07/23/2015  Decreased Interest 0 0  Down, Depressed, Hopeless 3 0  PHQ - 2 Score 3 0  Altered sleeping 3 -  Tired, decreased energy 0 -  Change in appetite 2 -  Feeling bad or  failure about yourself  3 -  Trouble concentrating 0 -  Moving slowly or fidgety/restless 2 -  Suicidal thoughts 0 -  PHQ-9 Score 13 -  Difficult doing work/chores Not difficult at all -    Fall Risk Screen: see questionnaire below. Fall Risk  02/02/2016 08/31/2015 07/23/2015  Falls in the past year? No No No    ADL screen:  See questionnaire below Functional Status Survey:     Review of Systems Negative except as above   PHYSICAL EXAM:   General Appearance: Alert, cooperative, no distress, appears stated age Head: Normocephalic, without obvious abnormality, atraumatic Eyes: PERRL, conjunctiva/corneas clear, EOM's intact, fundi benign Ears: Normal TM's and external ear canals Nose: Nares normal, mucosa normal, no drainage or sinus tenderness Throat: Lips, mucosa, and tongue normal; teeth and gums normal Neck: Supple, no lymphadenopathy;  thyroid:  no enlargement/tenderness/nodules; no carotid bruit or JVD Lungs: Clear to auscultation bilaterally without wheezes, rales or ronchi; respirations unlabored Heart: Regular rate and rhythm, S1 and S2 normal, no murmur, rubor gallop Abdomen: Soft, non-tender, nondistended, normoactive bowel sounds,  no masses, no hepatosplenomegaly Extremities: No clubbing, cyanosis or edema Pulses: 2+ and symmetric all extremities Skin:  Skin color, texture, turgor normal, no rashes or lesions Lymph nodes: Cervical, supraclavicular, and axillary nodes normal Neurologic:  CNII-XII intact, normal strength, sensation and gait; reflexes 2+ and symmetric throughout Psych: Normal mood, affect, hygiene and grooming.  ASSESSMENT/PLAN: Dysthymia  Idiopathic peripheral neuropathy  Hyperlipidemia with target LDL less than 100  Essential hypertension  MCI (mild cognitive impairment) with memory loss  History  of falling  History of breast cancer     at least 30 minutes of aerobic activity at least 5 days/week and weight-bearing exercise 2x/week;  proper sunscreen use reviewed; healthy diet, including goals of calcium and vitamin D intake and alcohol recommendations (less than or equal to 1 drink/day) reviewed; regular seatbelt use;   Immunization recommendations discussed.   Medicare Attestation I have personally reviewed: The patient's medical and social history Their use of alcohol, tobacco or illicit drugs Their current medications and supplements The patient's functional ability including ADLs,fall risks, home safety risks, cognitive, and hearing and visual impairment Diet and physical activities Evidence for depression or mood disorders  The patient's weight, height, and BMI have been recorded in the chart.  I have made referrals, counseling, and provided education to the patient based on review of the above and I have provided the patient with a written personalized care plan for preventive services.     Wyatt Haste, MD   08/26/2016

## 2016-09-15 DIAGNOSIS — H2513 Age-related nuclear cataract, bilateral: Secondary | ICD-10-CM | POA: Diagnosis not present

## 2016-09-19 ENCOUNTER — Other Ambulatory Visit: Payer: Self-pay | Admitting: Medical

## 2016-09-19 NOTE — Telephone Encounter (Signed)
Can she have a refill on this 

## 2016-09-19 NOTE — Telephone Encounter (Signed)
Please review, you saw her recently. This got sent to me in error I think

## 2016-09-20 DIAGNOSIS — M545 Low back pain: Secondary | ICD-10-CM | POA: Diagnosis not present

## 2016-09-20 DIAGNOSIS — M47816 Spondylosis without myelopathy or radiculopathy, lumbar region: Secondary | ICD-10-CM | POA: Diagnosis not present

## 2016-09-20 DIAGNOSIS — M5136 Other intervertebral disc degeneration, lumbar region: Secondary | ICD-10-CM | POA: Diagnosis not present

## 2016-09-20 DIAGNOSIS — M9903 Segmental and somatic dysfunction of lumbar region: Secondary | ICD-10-CM | POA: Diagnosis not present

## 2016-10-19 DIAGNOSIS — M545 Low back pain: Secondary | ICD-10-CM | POA: Diagnosis not present

## 2016-10-19 DIAGNOSIS — M9903 Segmental and somatic dysfunction of lumbar region: Secondary | ICD-10-CM | POA: Diagnosis not present

## 2016-10-19 DIAGNOSIS — M5136 Other intervertebral disc degeneration, lumbar region: Secondary | ICD-10-CM | POA: Diagnosis not present

## 2016-10-19 DIAGNOSIS — M47816 Spondylosis without myelopathy or radiculopathy, lumbar region: Secondary | ICD-10-CM | POA: Diagnosis not present

## 2016-10-20 DIAGNOSIS — Z01419 Encounter for gynecological examination (general) (routine) without abnormal findings: Secondary | ICD-10-CM | POA: Diagnosis not present

## 2016-10-20 DIAGNOSIS — Z1231 Encounter for screening mammogram for malignant neoplasm of breast: Secondary | ICD-10-CM | POA: Diagnosis not present

## 2016-10-23 ENCOUNTER — Other Ambulatory Visit: Payer: Self-pay | Admitting: Family Medicine

## 2016-10-23 DIAGNOSIS — I10 Essential (primary) hypertension: Secondary | ICD-10-CM

## 2016-11-16 DIAGNOSIS — M5136 Other intervertebral disc degeneration, lumbar region: Secondary | ICD-10-CM | POA: Diagnosis not present

## 2016-11-16 DIAGNOSIS — M47816 Spondylosis without myelopathy or radiculopathy, lumbar region: Secondary | ICD-10-CM | POA: Diagnosis not present

## 2016-11-16 DIAGNOSIS — M545 Low back pain: Secondary | ICD-10-CM | POA: Diagnosis not present

## 2016-11-16 DIAGNOSIS — M9903 Segmental and somatic dysfunction of lumbar region: Secondary | ICD-10-CM | POA: Diagnosis not present

## 2016-11-21 ENCOUNTER — Ambulatory Visit: Payer: PPO | Admitting: Family Medicine

## 2016-11-21 ENCOUNTER — Telehealth: Payer: Self-pay | Admitting: Family Medicine

## 2016-11-21 DIAGNOSIS — H3411 Central retinal artery occlusion, right eye: Secondary | ICD-10-CM | POA: Diagnosis not present

## 2016-11-21 DIAGNOSIS — H2511 Age-related nuclear cataract, right eye: Secondary | ICD-10-CM | POA: Diagnosis not present

## 2016-11-21 DIAGNOSIS — H2512 Age-related nuclear cataract, left eye: Secondary | ICD-10-CM | POA: Diagnosis not present

## 2016-11-21 NOTE — Telephone Encounter (Signed)
Patient called not knowing who to make an appt with.  She was light headed on Friday and then she lost her vision,  She can now see purple and a little vision.  Discussed with Dr. Redmond School and he said see eye dr if no other weakness.  Pt advised NO OTHER WEAKNESS.  She will schedule with eye dr.

## 2016-11-22 ENCOUNTER — Encounter: Payer: Self-pay | Admitting: Family Medicine

## 2016-11-22 ENCOUNTER — Ambulatory Visit (INDEPENDENT_AMBULATORY_CARE_PROVIDER_SITE_OTHER): Payer: PPO | Admitting: Family Medicine

## 2016-11-22 VITALS — BP 156/108 | HR 95 | Wt 213.6 lb

## 2016-11-22 DIAGNOSIS — Z135 Encounter for screening for eye and ear disorders: Secondary | ICD-10-CM | POA: Diagnosis not present

## 2016-11-22 DIAGNOSIS — H3411 Central retinal artery occlusion, right eye: Secondary | ICD-10-CM | POA: Diagnosis not present

## 2016-11-22 LAB — CBC WITH DIFFERENTIAL/PLATELET
BASOS ABS: 38 {cells}/uL (ref 0–200)
BASOS PCT: 0.5 %
EOS PCT: 1.2 %
Eosinophils Absolute: 90 cells/uL (ref 15–500)
HEMATOCRIT: 41.5 % (ref 35.0–45.0)
HEMOGLOBIN: 14.2 g/dL (ref 11.7–15.5)
LYMPHS ABS: 1920 {cells}/uL (ref 850–3900)
MCH: 30.6 pg (ref 27.0–33.0)
MCHC: 34.2 g/dL (ref 32.0–36.0)
MCV: 89.4 fL (ref 80.0–100.0)
MPV: 11.2 fL (ref 7.5–12.5)
Monocytes Relative: 5.7 %
NEUTROS ABS: 5025 {cells}/uL (ref 1500–7800)
Neutrophils Relative %: 67 %
Platelets: 222 10*3/uL (ref 140–400)
RBC: 4.64 10*6/uL (ref 3.80–5.10)
RDW: 12.7 % (ref 11.0–15.0)
Total Lymphocyte: 25.6 %
WBC mixed population: 428 cells/uL (ref 200–950)
WBC: 7.5 10*3/uL (ref 3.8–10.8)

## 2016-11-22 LAB — SEDIMENTATION RATE: SED RATE: 28 mm/h (ref 0–30)

## 2016-11-22 LAB — HOMOCYSTEINE: Homocysteine: 17 umol/L — ABNORMAL HIGH (ref <10.4)

## 2016-11-22 NOTE — Progress Notes (Signed)
   Subjective:    Patient ID: Stacey Wells, female    DOB: [birth year], 76 y.o.   MRN: [id number]  HPI She is here for consultation concerning recent difficulty with vision in her right eye. She noted being slightly lightheaded last Saturday and then noted visual disturbance mainly in the right eye and seeing purple in that eye. She noted when she got up Sunday that the symptoms were even worse. She is then called our office and she was referred to ophthalmology. She originally saw Dr. Gershon Crane and was referred then to Dr. Zadie Rhine who diagnosed central retinal artery occlusion. She is here for further workup concerning that. She is quite distraught over this. She does complain of a long history of intermittent dizziness and also has had some peripheral edema and is wondering if this is related to it.   Review of Systems     Objective:   Physical Exam Alert and in no distress otherwise not examined EKG shows no acute changes      Assessment & Plan:  Central retinal artery occlusion of right eye - Plan: Sedimentation rate, CBC with Differential/Platelet, Homocysteine, US Carotid Duplex Bilateral, ECHOCARDIOGRAM COMPLETE, EKG 12-Lead  Screening for eye condition - Plan: Sedimentation rate, CBC with Differential/Platelet, Homocysteine, US Carotid Duplex Bilateral, ECHOCARDIOGRAM COMPLETE, EKG 12-Lead  The above screening was ordered per request of Dr. Zadie Rhine. We will probably also refer to Dr. Brett Fairy who she has seen in the past.

## 2016-11-24 DIAGNOSIS — H3411 Central retinal artery occlusion, right eye: Secondary | ICD-10-CM | POA: Diagnosis not present

## 2016-11-24 DIAGNOSIS — H1132 Conjunctival hemorrhage, left eye: Secondary | ICD-10-CM | POA: Diagnosis not present

## 2016-11-24 DIAGNOSIS — H2511 Age-related nuclear cataract, right eye: Secondary | ICD-10-CM | POA: Diagnosis not present

## 2016-11-24 DIAGNOSIS — H2512 Age-related nuclear cataract, left eye: Secondary | ICD-10-CM | POA: Diagnosis not present

## 2016-11-25 ENCOUNTER — Other Ambulatory Visit: Payer: Self-pay

## 2016-11-25 ENCOUNTER — Telehealth: Payer: Self-pay

## 2016-11-25 NOTE — Telephone Encounter (Signed)
Pt is scheduled for Echo on Monday at 10 and cartoid doppler at 2:15 pm at [facility]. Pt aware. Will send message to [facility] for them to contact pt for sooner f/u appt.  Stacey Wells

## 2016-11-25 NOTE — Telephone Encounter (Signed)
Let her know that this is a standard workup for the problem that she has and that both the ophthalmologist and I agreed that this needs to be done. Go ahead and schedule her to see Dr. Brett Fairy after the carotid Doppler and echocardiogram are done

## 2016-11-25 NOTE — Telephone Encounter (Signed)
PT questions why she needs an echocardiogram when she was told that she only needed the vessels in her neck looked at. Echo is scheduled on Monday at 10am.

## 2016-11-25 NOTE — Telephone Encounter (Signed)
LM for Stacey Wells at [facility] care to call back to schedule doppler and referral to Dr. Brett Fairy. Stacey Wells December

## 2016-11-28 ENCOUNTER — Ambulatory Visit (HOSPITAL_BASED_OUTPATIENT_CLINIC_OR_DEPARTMENT_OTHER): Payer: PPO

## 2016-11-28 ENCOUNTER — Encounter (HOSPITAL_COMMUNITY): Payer: PPO

## 2016-11-28 ENCOUNTER — Encounter: Payer: Self-pay | Admitting: Family Medicine

## 2016-11-28 ENCOUNTER — Other Ambulatory Visit: Payer: Self-pay

## 2016-11-28 ENCOUNTER — Ambulatory Visit (HOSPITAL_COMMUNITY)
Admission: RE | Admit: 2016-11-28 | Discharge: 2016-11-28 | Disposition: A | Discharge status: Home / Self Care | Payer: PPO | Source: Ambulatory Visit | Attending: Internal Medicine | Admitting: Internal Medicine

## 2016-11-28 DIAGNOSIS — H3411 Central retinal artery occlusion, right eye: Secondary | ICD-10-CM

## 2016-11-28 DIAGNOSIS — I340 Nonrheumatic mitral (valve) insufficiency: Secondary | ICD-10-CM | POA: Insufficient documentation

## 2016-11-28 DIAGNOSIS — Z8673 Personal history of transient ischemic attack (TIA), and cerebral infarction without residual deficits: Secondary | ICD-10-CM | POA: Diagnosis not present

## 2016-11-28 DIAGNOSIS — Z135 Encounter for screening for eye and ear disorders: Secondary | ICD-10-CM | POA: Insufficient documentation

## 2016-11-28 DIAGNOSIS — I10 Essential (primary) hypertension: Secondary | ICD-10-CM | POA: Insufficient documentation

## 2016-12-01 ENCOUNTER — Telehealth: Payer: Self-pay

## 2016-12-01 NOTE — Telephone Encounter (Signed)
LMTCB

## 2016-12-05 NOTE — Telephone Encounter (Signed)
Pt has appt on 11/30 with neurology with Hassell Done NP, do you want her seen sooner than this to f/u on studies recently done?

## 2016-12-05 NOTE — Telephone Encounter (Signed)
I am okay with her waiting until the 30th. Make sure she knows that all the tests so far have been negative

## 2016-12-06 NOTE — Telephone Encounter (Signed)
LM on VCM that test were negative and ok to see neurologist on 11/30. Advised to callback if any questions. Stacey Wells

## 2016-12-08 ENCOUNTER — Telehealth: Payer: Self-pay | Admitting: Family Medicine

## 2016-12-08 DIAGNOSIS — H2511 Age-related nuclear cataract, right eye: Secondary | ICD-10-CM | POA: Diagnosis not present

## 2016-12-08 DIAGNOSIS — H1132 Conjunctival hemorrhage, left eye: Secondary | ICD-10-CM | POA: Diagnosis not present

## 2016-12-08 DIAGNOSIS — H2512 Age-related nuclear cataract, left eye: Secondary | ICD-10-CM | POA: Diagnosis not present

## 2016-12-08 DIAGNOSIS — H3411 Central retinal artery occlusion, right eye: Secondary | ICD-10-CM | POA: Diagnosis not present

## 2016-12-08 NOTE — Telephone Encounter (Signed)
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  Takes daily aspirin 325mg  and wants to know it it's safe for her also to take Advil for this pain, says Advil does help but she wanted to make sure it's safe to take both.  Please advise is so and how many she can take a day?

## 2016-12-09 NOTE — Telephone Encounter (Signed)
She can take the Advil but have her come in so we can further assess her pain

## 2016-12-09 NOTE — Telephone Encounter (Signed)
Called and notified pt, she said that she will call back to make an appt. She said that her shoulder is feeling better at this time.

## 2016-12-19 ENCOUNTER — Ambulatory Visit (INDEPENDENT_AMBULATORY_CARE_PROVIDER_SITE_OTHER): Payer: PPO | Admitting: Family Medicine

## 2016-12-19 VITALS — BP 138/88 | HR 80 | Ht 65.0 in | Wt 216.8 lb

## 2016-12-19 DIAGNOSIS — H3411 Central retinal artery occlusion, right eye: Secondary | ICD-10-CM

## 2016-12-19 DIAGNOSIS — Z23 Encounter for immunization: Secondary | ICD-10-CM | POA: Diagnosis not present

## 2016-12-19 DIAGNOSIS — Z135 Encounter for screening for eye and ear disorders: Secondary | ICD-10-CM

## 2016-12-19 DIAGNOSIS — M791 Myalgia, unspecified site: Secondary | ICD-10-CM | POA: Diagnosis not present

## 2016-12-19 NOTE — Progress Notes (Signed)
   Subjective:    Patient ID: Stacey Wells, female    DOB: 21-Mar-1940, 77 y.o.   MRN: [id number]  HPI She is here for consult concerning continued difficulty with upper extremity aches and pains. She was recently seen and evaluated for central retinal artery occlusion. She was told to stop Advil and switch to aspirin. Since then she has had a lot of aches and pains. She has started taking Advil again taking 2 pills twice per day which has helped with the upper extremity aches and pains. Review of the record indicates her sedimentation rate was within the normal range in the homocysteine was elevated. She also states that she has some slight right lateral vision but no central vision.   Review of Systems     Objective:   Physical Exam Alert and in no distress. Active range of motion of the shoulders was uncomfortable. Passive range of motion causes no discomfort.       Assessment & Plan:  Need for influenza vaccination - Plan: Flu vaccine HIGH DOSE PF (Fluzone High dose)  Central retinal artery occlusion of right eye - Plan: Homocysteine, Sedimentation rate  Screening for eye condition - Plan: Homocysteine, Sedimentation rate  Myalgia I will do some blood screening again for sedimentation rate and homocysteine the make sure we are not also dealing with PMR. We'll also revisit aspirin versus Advil especially since she has received some good benefit from relatively low dosing of Advil 2 pills twice a day.

## 2016-12-20 LAB — HOMOCYSTEINE: Homocysteine: 27.6 umol/L — ABNORMAL HIGH (ref <10.4)

## 2016-12-20 LAB — SEDIMENTATION RATE: Sed Rate: 28 mm/h (ref 0–30)

## 2016-12-26 ENCOUNTER — Other Ambulatory Visit: Payer: Self-pay | Admitting: Medical

## 2016-12-26 NOTE — Telephone Encounter (Signed)
Can pt have a refill on meds  

## 2016-12-28 ENCOUNTER — Telehealth: Payer: Self-pay | Admitting: Family Medicine

## 2016-12-28 NOTE — Telephone Encounter (Signed)
Pt wants to know why she is hurting and needs rx for pain to the [facility] at [facility]. She states the Advil is not working.  She is waking up at night hurting and can't get comfortable. Pt ph [phone]  [phone]

## 2016-12-28 NOTE — Telephone Encounter (Signed)
Let her know that it is unclear why she is aching this much. I ruled out some of the inflammatory problems already. Call in Voltaren 75 mg twice a  Day#14. Have her schedule an appointment t the beginning of the week to readdress this

## 2016-12-28 NOTE — Telephone Encounter (Signed)
Called pt advised her per Dr. Redmond School.  Set up appt for next Tuesday.  Called [facility] and gave rx for Voltaren.

## 2017-01-01 ENCOUNTER — Other Ambulatory Visit: Payer: Self-pay | Admitting: Family Medicine

## 2017-01-01 DIAGNOSIS — G609 Hereditary and idiopathic neuropathy, unspecified: Secondary | ICD-10-CM

## 2017-01-02 NOTE — Telephone Encounter (Signed)
ok 

## 2017-01-03 ENCOUNTER — Ambulatory Visit: Payer: PPO | Admitting: Family Medicine

## 2017-01-03 ENCOUNTER — Encounter: Payer: Self-pay | Admitting: Family Medicine

## 2017-01-03 VITALS — BP 170/90 | HR 76 | Resp 16 | Wt 219.6 lb

## 2017-01-03 DIAGNOSIS — H3411 Central retinal artery occlusion, right eye: Secondary | ICD-10-CM

## 2017-01-03 DIAGNOSIS — M791 Myalgia, unspecified site: Secondary | ICD-10-CM

## 2017-01-03 DIAGNOSIS — R5383 Other fatigue: Secondary | ICD-10-CM

## 2017-01-03 NOTE — Progress Notes (Signed)
   Subjective:    Patient ID: Stacey Wells, female    DOB: 1940-09-21, 76 y.o.   MRN: [id number]  HPI She is here for a recheck.  She continues to have difficulty with upper extremity aches and pains as well as weakness.  She also complains of fatigue and has noted some lower extremity edema.  This all started with the onset of her central retinal vein occlusion.  She was worked up for this and did have some inflammatory markers elevated.  She has had no joint swelling.  Recent sed rate on 2 occasions was in the normal range.  Presently she is using Voltaren and getting some relief but it tends to run out fairly quickly.  She is also using amitriptyline for the neuropathy in her feet which does seem to help.   Review of Systems     Objective:   Physical Exam Alert and in no distress otherwise not examined.  Please see previous evaluations  Sed rate 28.  Homocysteine of 27.6.     Assessment & Plan:  Myalgia - Plan: Rheumatoid Arthritis Diagnostic Panel, Comprehensive, Ambulatory referral to Rheumatology  Fatigue, unspecified type - Plan: Rheumatoid Arthritis Diagnostic Panel, Comprehensive, Ambulatory referral to Rheumatology  Central retinal artery occlusion of right eye - Plan: Rheumatoid Arthritis Diagnostic Panel, Comprehensive, Ambulatory referral to Rheumatology  Some of her symptoms do sound like PMR however her sed rate is not elevated.  I will do some further screening and refer to rheumatology.

## 2017-01-04 ENCOUNTER — Other Ambulatory Visit: Payer: Self-pay

## 2017-01-04 ENCOUNTER — Telehealth: Payer: Self-pay | Admitting: Family Medicine

## 2017-01-04 MED ORDER — DICLOFENAC SODIUM 75 MG PO TBEC: 75.0000 mg | DELAYED_RELEASE_TABLET | Freq: Three times a day (TID) | ORAL | 0 refills | Status: DC

## 2017-01-04 NOTE — Telephone Encounter (Signed)
Increase to 3 times daily

## 2017-01-04 NOTE — Progress Notes (Signed)
Per [employer]- ok to renew diclofenac tid. RX sent in. Victorino December

## 2017-01-04 NOTE — Telephone Encounter (Signed)
done

## 2017-01-04 NOTE — Telephone Encounter (Signed)
Pt said Voltaren with directions of taking 3 times a day was supposed to have been sent to pharmacy. Pt is in pain and need med asap.

## 2017-01-09 ENCOUNTER — Ambulatory Visit: Payer: PPO | Admitting: Family Medicine

## 2017-01-09 ENCOUNTER — Encounter: Payer: Self-pay | Admitting: Family Medicine

## 2017-01-09 VITALS — BP 170/80 | HR 80 | Resp 16

## 2017-01-09 DIAGNOSIS — M791 Myalgia, unspecified site: Secondary | ICD-10-CM | POA: Diagnosis not present

## 2017-01-09 DIAGNOSIS — M25511 Pain in right shoulder: Secondary | ICD-10-CM

## 2017-01-09 DIAGNOSIS — M25512 Pain in left shoulder: Secondary | ICD-10-CM

## 2017-01-09 DIAGNOSIS — H3411 Central retinal artery occlusion, right eye: Secondary | ICD-10-CM

## 2017-01-09 DIAGNOSIS — R5383 Other fatigue: Secondary | ICD-10-CM | POA: Diagnosis not present

## 2017-01-09 MED ORDER — METHYLPREDNISOLONE ACETATE 80 MG/ML IJ SUSP
80.0000 mg | Freq: Once | INTRAMUSCULAR | Status: AC
  Administered 2017-01-09: 80 mg via INTRAMUSCULAR

## 2017-01-09 NOTE — Progress Notes (Signed)
   Subjective:    Patient ID: Stacey Wells, female    DOB: Aug 13, 1940, 76 y.o.   MRN: [id number]  HPI She is here for a consult.  She continues to have upper extremity pain and now specifically in the right shoulder deltoid area.  She then related the fact that her sister had a similar problem and was given a steroid injection and noted complete resolution of all of her arthritic symptoms.  All the symptoms this time around with her started when she had the central retinal artery occlusion.    Review of Systems     Objective:   Physical Exam Alert and in no distress otherwise not examined       Assessment & Plan:  Bilateral shoulder pain, unspecified chronicity - Plan: methylPREDNISolone acetate (DEPO-MEDROL) injection 80 mg  Myalgia  Fatigue, unspecified type  Central retinal artery occlusion of right eye  I will go ahead and give her a dental injection and see what benefit it has.  She presently does have an appointment with rheumatology in mid January.  It would be interesting to see how she responds to the injection as I do think she has an inflammatory problem going on.

## 2017-01-16 LAB — RHEUMATOID ARTHRITIS DIAGNOSTIC PANEL, COMPREHENSIVE: Rheumatoid Factor (IgM): <5 U (ref <=6)

## 2017-01-17 ENCOUNTER — Other Ambulatory Visit: Payer: Self-pay | Admitting: Medical

## 2017-01-17 NOTE — Telephone Encounter (Signed)
Ok to renew?  

## 2017-01-19 NOTE — Progress Notes (Signed)
[facility]  PATIENT: RONDI IVY DOB: 1940-04-28   REASON FOR VISIT: Follow-up for memory loss, gait abnormality, reports right retinal occlusion HISTORY FROM: Patient and husband    [place] ILLNESS:Mrs. Spath , a married and right handed caucasian patient from [place] reports  she has continued to fall frequently. Apparently she was seen by Dr. Janann Colonel for the same concern in addition at his visit with him she had mentioned a cognitive concern.After being evaluated by the movement disorder specialist, she was diagnosed with Parkinson's syndrome, which terrifies her, of course. She was also sent for an MRI of the brain which returned normal for age with some chronic microvascular ischemia that was only mild. The patient has an interesting story to her frequent falls she seems sometimes to have a warning that she knows she may follow but some of her falls occur out of the blue and very sudden. She has suffered injuries and she is visibly bruised for example on the lateral epicondyle of the right elbow, she describes large hematomata on her buttocks and on the abdomen left more than right. The vertebral fracture in the past. She has only once hit her head during a fall but at that time hit the chest of drawers with a hard surface. One time she fell outside and hit the back of her head on the grass, but she had trouble to get back up. The falls appear independent of time I'm of day, or surrounding, of proximity to mealtimes, of fluid intake, this seems to be no special activity that may trigger it.  They can occur as the patient stands or sits or walks. Independent of the falls the patient has had some episodes of sudden weakness, one of them she describes as feeling a wave of heat coming over her she was extremely hot. She describes that she slid from a chair onto the bed and then couldn't find the strength to get up again and she was confused about her  surroundings. It may have taken her several minutes to reorient herself to the surroundings. She has also started to use night lights as she feels more disoriented in the dark. She was referred for cardiac monitoring and this resulted in normal tracing.   She had a normal EEG at this office.  She reports ongoing confusion, getting lost in a familiar environment and gets parts of conversations. Her husband has noted that there is a cognitive problem and he has mentioned is here today. My nurse Lovey Newcomer performed a Montral cognitive assessment test today with the patient. He was able to name 3 animals, she was perfectly fine in drawing a clock face and placing the hands of the clock at 11:10. She had trouble copying a every image and the Trail Making Test gave her difficulties. Her attention was excellent and she was able to repeat a list of digits without problem. She performed 2 out of 5 subtractions correctly, but she did not word for word repeat the sentences given to her. MOCA was was tested and the patient was able minutes several minutes after the initial test was performed to recall 4 out of 5 words. She appears anxious, afraid to fail. 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 No known strokes or TIAs. Per patient she has a lumbar fracture, she is followed by ortho for this and is wearing a brace. She is taking tramadol as needed for pain.   CM : Miss Beaudoin, 76 year old female  returns for follow-up. She was last seen 02/04/15.  She was placed on Aricept in March 2016 but had vomiting from the medication. She was then placed on Namenda extended release and titrated slowly.The patient feels [name] has been beneficial and she feels better. She did have a PT evaluation but the patient does not feel like physical therapy was beneficial for her. She denies any falls since last seen . She has not had any falls since her statin drugs were discontinued. She continues to do her household  duties without difficulty, cooking cleaning etc., she also has a garden.  She returns for reevaluation.   Interval history from 02/02/2016, Mrs. Bellotti is seen here today with a much more stable gait, more alert and not in pain or discomfort. She describes home ultimate medications were discontinued by her primary care physician's which all seemed to have contributed to orthostatic dizziness, intractable coughing, myalgia and falling with muscle weakness. I have listed these in detail below. I have followed up today to see if her memory complaint has progressed. A Montral cognitive assessment was performed in the office by my nurse, Leighton Parody, RN. She scored 26 out of 30 points which is considered normal for age and education. She did not miss any of the delayed recall words which are usually the first indicator of short-term memory loss.  CD Interval history from 07/21/2016. I have the pleasure of seeing Mr. [name] today. Mrs. Frese reports that she had experienced hot flushes and back pain but on Namenda XR, a once a daily 28 mg dose of this medication. The symptoms resolved when she discontinued the medication. She also did not longer experience flushing and diaphoresis. She had constant hot flashes while on the medication. Unfortunately her gait instability has returned which seemed to have resolved after statins were discontinued. She had 4 near falls just in the last week. While neither on Namenda nor Aricept she has noticed a decline in her cognitive function and today's Montral cognitive assessment places her for the first time ever in a category that could correspond to early dementia. Mild cognitive impairment patients have a risk of 7% per year to convert to Alzheimer's disease. We discussed today if he should try a lower dose of Namenda which seemed not to have created the back problems and hopefully can help her to stabilize. In addition we will pay special attention to her gait  disorder today. The patient's blood pressure was elevated at today's check in time UPDATE 11/30/2018CM Ms. Prokop, 76 year old female returns for follow-up with a history of memory loss/early dementia.  She also has a history of gait disorder however claims when she got off of statin drugs she has not had any further falls.  She is currently on Namenda 21mg  ER 2 tabs daily.  She has failed Aricept in the past.  She has failed higher doses of Namenda.  She tells me that in September over a weekend she had visual loss in her right eye.  She called her primary care who sent her to an eye doctor who then told her she had had a retinal artery occlusion .  She was placed on aspirin 325 however Dr. Redmond School  took her off of that, because she is on Voltaren 75mg   3 times daily.  She has no vision in the right eye and she wears a patch.  She had recent injection in her shoulder and has been referred to a rheumatologist.  She returns for reevaluation REVIEW  OF SYSTEMS: Full 14 system review of systems performed and notable only for those listed, all others are neg:  Constitutional: neg  Cardiovascular: neg Ear/Nose/Throat: neg  Skin: neg Eyes: Vision loss right eye due to retinal occlusion Respiratory: neg Gastroitestinal: neg  Hematology/Lymphatic: neg  Endocrine: neg Musculoskeletal: Shoulder pain muscle aches Allergy/Immunology: neg Neurological: Mild dementia  psychiatric: neg Sleep : neg   ALLERGIES: Allergies  Allergen Reactions  . Aricept [Donepezil Hcl] Nausea And Vomiting    vomiting  . Prozac [Fluoxetine Hcl] Other (See Comments)    hallucinations  . Statins Other (See Comments)    Dizziness and lightheadedness.  . Sulfamethoxazole     HOME MEDICATIONS: Outpatient Medications Prior to Visit  Medication Sig Dispense Refill  . amitriptyline (ELAVIL) 50 MG tablet TAKE 1 TABLET(50 MG) BY MOUTH AT BEDTIME 90 tablet 0  . BYSTOLIC 5 MG tablet TAKE 1 TABLET(5 MG) BY MOUTH DAILY 90 tablet 0   . citalopram (CELEXA) 20 MG tablet TAKE 1 TABLET(20 MG) BY MOUTH DAILY 90 tablet 0  . diclofenac (VOLTAREN) 75 MG EC tablet Take 1 tablet (75 mg total) 3 (three) times daily by mouth. 90 tablet 0  . Memantine HCl ER 21 MG CP24 Take 1 capsule by mouth 2 (two) times daily.     No facility-administered medications prior to visit.     PAST MEDICAL HISTORY: Past Medical History:  Diagnosis Date  . Anxiety   . Arthritis   . Cancer ([place]) 09/1999   BREAST  . Dementia    on Namenda which has caused good improvement  . 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 Dyslipidemia   . Dysthymia   . Frequent falls    pt states she falls frequently  . Glucose intolerance (impaired glucose tolerance)   . Hypertension   . Migraine headache    none since menopause  . PONV (postoperative nausea and vomiting)    has had with all surgeries  . Renal insufficiency   . Stroke [facility]) 2015, 11/19/16   Pt states was told she had a "mild stroke", no deficits  . Syncope Sept, 2016   did not seek medical care  . Trigger ring finger of right hand 12/19/2014    PAST SURGICAL HISTORY: Past Surgical History:  Procedure Laterality Date  . BREAST LUMPECTOMY Right 2001  . DILATION AND CURETTAGE OF UTERUS    . ELBOW SURGERY Right   . JOINT REPLACEMENT    . REPLACEMENT TOTAL KNEE Bilateral 2002/2003  . TRIGGER FINGER RELEASE Right 12/19/2014   Procedure: RELEASE RIGHT RING TRIGGER FINGER/A-1 PULLEY;  Surgeon: Marchia Bond, MD;  Location: [place];  Service: Orthopedics;  Laterality: Right;    FAMILY HISTORY: Family History  Problem Relation Age of Onset  . Stroke Sister   . Heart attack Sister   . Stroke Brother   . Heart attack Brother     SOCIAL HISTORY: Social History   Socioeconomic History  . Marital status: Married    Spouse name: Not on file  . Number of children: Not on file  . Years of education: HS  . Highest education level: Not on file  Social Needs  .  Financial resource strain: Not on file  . Food insecurity - worry: Not on file  . Food insecurity - inability: Not on file  . Transportation needs - medical: Not on file  . Transportation needs - non-medical: Not on file  Occupational History  . Not  on file  Tobacco Use  . Smoking status: Never Smoker  . Smokeless tobacco: Never Used  Substance and Sexual Activity  . Alcohol use: Yes    Alcohol/week: 0.0 oz    Comment: daily glass of wine with dinner  . Drug use: No  . Sexual activity: Not Currently  Other Topics Concern  . Not on file  Social History Narrative   Married, 4 children   Right handed   12 th grade   1 cup daily     PHYSICAL EXAM  Vitals:   01/20/17 0839 01/20/17 0858  BP: (!) 176/96 (!) 160/96  Pulse: 74   Weight: 215 lb 3.2 oz (97.6 kg)    Body mass index is 35.81 kg/m.  Generalized: Well developed, obese female in no acute distress  Head: normocephalic and atraumatic,. Oropharynx benign  Neck: Supple, no carotid bruits  Cardiac: Regular rate rhythm, no murmur  Musculoskeletal: No deformity   Neurological examination   Mentation: Alert .  Montreal Cognitive Assessment  01/20/2017 07/21/2016 02/02/2016 08/04/2015 02/04/2015  Visuospatial/ Executive (0/5) 3 4 4 5 5   Naming (0/3) 3 3 3 3 3   Attention: Read list of digits (0/2) 1 0 2 2 1   Attention: Read list of letters (0/1) 1 1 1 1 1   Attention: Serial 7 subtraction starting at 100 (0/3) 0 0 0 3 3  Language: Repeat phrase (0/2) 1 0 2 2 0  Language : Fluency (0/1) 1 1 1 1 1   Abstraction (0/2) 2 2 2 2  0  Delayed Recall (0/5) 2 4 5 4 4   Orientation (0/6) 5 6 6 6 5   Total 19 21 26 29 23   Adjusted Score (based on education) 20 21 26  - -   Cranial nerve II-XII: Left Pupil equal round reactive to light , blind in the right due to retinal occlusion extraocular movements were full, visual field were full on confrontational test. Facial sensation and strength were normal. hearing was intact to finger  rubbing bilaterally. Uvula tongue midline. head turning and shoulder shrug were normal and symmetric.Tongue protrusion into cheek strength was normal. Motor: normal bulk and tone, full strength in the BUE, BLE, fine finger movements normal, no pronator drift. No focal weakness Sensory: normal and symmetric to light touch,  Coordination: finger-nose-finger, heel-to-shin bilaterally, no dysmetria Reflexes: Brachioradialis 2/2, biceps 2/2, triceps 2/2, patellar 2/2, Achilles 2/2, plantar responses were flexor bilaterally. Gait and Station: Rising up from seated position without assistance, normal stance,  moderate stride, good arm swing, smooth turning, able to perform tiptoe, and heel walking without difficulty. Tandem gait is mildly unsteady.  No assistive device  DIAGNOSTIC DATA (LABS, IMAGING, TESTING) - I reviewed patient records, labs, notes, testing and imaging myself where available.  Lab Results  Component Value Date   WBC 7.5 11/22/2016   HGB 14.2 11/22/2016   HCT 41.5 11/22/2016   MCV 89.4 11/22/2016   PLT 222 11/22/2016      Component Value Date/Time   NA 136 08/26/2016 1221   K 4.7 08/26/2016 1221   CL 103 08/26/2016 1221   CO2 21 08/26/2016 1221   GLUCOSE 85 08/26/2016 1221   BUN 22 08/26/2016 1221   CREATININE 1.15 (H) 08/26/2016 1221   CALCIUM 9.3 08/26/2016 1221   PROT 7.0 08/26/2016 1221   PROT 6.9 07/01/2013 1404   ALBUMIN 4.0 08/26/2016 1221   AST 21 08/26/2016 1221   ALT 13 08/26/2016 1221   ALKPHOS 83 08/26/2016 1221   BILITOT 0.5 08/26/2016  1221   Lab Results  Component Value Date   CHOL 301 (H) 08/26/2016   HDL 59 08/26/2016   LDLCALC 210 (H) 08/26/2016   TRIG 159 (H) 08/26/2016   CHOLHDL 5.1 (H) 08/26/2016   Lab Results  Component Value Date   HGBA1C 5.8 (H) 07/01/2013   Lab Results  Component Value Date   HQPRFFMB84 665 07/01/2013   Lab Results  Component Value Date   TSH 1.260 07/01/2013      ASSESSMENT AND PLAN  76 y.o. year old  female  has a past medical history of MCI -76y.o. year old female, who has a medical history of mild cognitive impairment with improvement in her MOCA score since being placed on Namenda extended release. She vomited on Aricept.  Aricept was discontinued.   Previous gait disorder but improved off statins recent diagnosis of right retinal occlusion.   She was placed on aspirin by Dr. Zadie Rhine, discontinued by Dr.Lalonde.  On diclofenac 3 times a day  MOCA score is stable Continue  Memantine at current dose No recent falls F/U 6 to 8 months, next with Dr. Brett Fairy Dennie Bible, [facility], [facility], [facility] [address] [place], [place] [address] [phone]

## 2017-01-20 ENCOUNTER — Ambulatory Visit: Payer: PPO | Admitting: Nurse Practitioner

## 2017-01-20 ENCOUNTER — Encounter: Payer: Self-pay | Admitting: Nurse Practitioner

## 2017-01-20 VITALS — BP 160/96 | HR 74 | Wt 215.2 lb

## 2017-01-20 DIAGNOSIS — H34231 Retinal artery branch occlusion, right eye: Secondary | ICD-10-CM | POA: Diagnosis not present

## 2017-01-20 DIAGNOSIS — G3184 Mild cognitive impairment, so stated: Secondary | ICD-10-CM

## 2017-01-20 NOTE — Patient Instructions (Signed)
MOCA score is stable Continue  Memantine at current dose No recent falls F/U 6 to 8 months

## 2017-01-23 NOTE — Progress Notes (Signed)
I agree with the assessment and plan as directed by NP .The patient is known to me .   Draiden Mirsky, MD  

## 2017-01-31 ENCOUNTER — Other Ambulatory Visit: Payer: Self-pay | Admitting: Family Medicine

## 2017-02-19 ENCOUNTER — Other Ambulatory Visit: Payer: Self-pay | Admitting: Family Medicine

## 2017-02-19 DIAGNOSIS — I10 Essential (primary) hypertension: Secondary | ICD-10-CM

## 2017-02-27 ENCOUNTER — Other Ambulatory Visit: Payer: Self-pay | Admitting: Medical

## 2017-02-27 NOTE — Telephone Encounter (Signed)
Can pt have a refill on meds  

## 2017-03-02 ENCOUNTER — Telehealth: Payer: Self-pay | Admitting: Family Medicine

## 2017-03-02 ENCOUNTER — Other Ambulatory Visit: Payer: Self-pay | Admitting: Neurology

## 2017-03-02 NOTE — Telephone Encounter (Signed)
Pt would like refill Voltaren to [facility]

## 2017-03-03 MED ORDER — DICLOFENAC SODIUM 75 MG PO TBEC: DELAYED_RELEASE_TABLET | ORAL | 2 refills | Status: AC

## 2017-03-08 DIAGNOSIS — M150 Primary generalized (osteo)arthritis: Secondary | ICD-10-CM | POA: Diagnosis not present

## 2017-03-08 DIAGNOSIS — M2550 Pain in unspecified joint: Secondary | ICD-10-CM | POA: Diagnosis not present

## 2017-03-08 DIAGNOSIS — Z6834 Body mass index (BMI) 34.0-34.9, adult: Secondary | ICD-10-CM | POA: Diagnosis not present

## 2017-03-08 DIAGNOSIS — M791 Myalgia, unspecified site: Secondary | ICD-10-CM | POA: Diagnosis not present

## 2017-03-08 DIAGNOSIS — H54414A Blindness right eye category 4, normal vision left eye: Secondary | ICD-10-CM | POA: Diagnosis not present

## 2017-03-08 DIAGNOSIS — E669 Obesity, unspecified: Secondary | ICD-10-CM | POA: Diagnosis not present

## 2017-03-31 ENCOUNTER — Other Ambulatory Visit: Payer: Self-pay | Admitting: Family Medicine

## 2017-03-31 DIAGNOSIS — G609 Hereditary and idiopathic neuropathy, unspecified: Secondary | ICD-10-CM

## 2017-03-31 MED ORDER — AMITRIPTYLINE HCL 50 MG PO TABS: ORAL_TABLET | ORAL | 3 refills | Status: AC

## 2017-03-31 NOTE — Telephone Encounter (Signed)
Can pt have a refill on med 

## 2017-03-31 NOTE — Addendum Note (Signed)
Addended by: Denita Lung on: 03/31/2017 04:54 PM   Modules accepted: Orders

## 2017-03-31 NOTE — Telephone Encounter (Signed)
yours

## 2017-04-05 ENCOUNTER — Encounter: Payer: Self-pay | Admitting: Family Medicine

## 2017-04-05 ENCOUNTER — Telehealth: Payer: Self-pay

## 2017-04-05 ENCOUNTER — Telehealth: Payer: Self-pay | Admitting: Family Medicine

## 2017-04-05 ENCOUNTER — Ambulatory Visit (INDEPENDENT_AMBULATORY_CARE_PROVIDER_SITE_OTHER): Payer: PPO | Admitting: Family Medicine

## 2017-04-05 VITALS — BP 170/100 | HR 77 | Wt 215.0 lb

## 2017-04-05 DIAGNOSIS — M353 Polymyalgia rheumatica: Secondary | ICD-10-CM | POA: Diagnosis not present

## 2017-04-05 NOTE — Telephone Encounter (Signed)
[facility] t# [phone] Dr. Trudie Reed office for copy office notes, they were faxed and given to  Dr. Redmond School

## 2017-04-05 NOTE — Telephone Encounter (Signed)
Pt called and said she takes 20 mg of Prednisone

## 2017-04-05 NOTE — Progress Notes (Signed)
   Subjective:    Patient ID: Stacey Wells, female    DOB: 1940/05/02, 77 y.o.   MRN: [id number]  HPI She is here for consultation after recent visit to see rheumatology.  She states that she had a very bad experience there and felt that the doctor was rude to her and also made disparaging comments concerning me. I did review the notes from Dr. Lenna Gilford and she felt as if I did that this is probably PMR.  Her note states that she was started on 15 mg of prednisone however actually it has been on 60.  The patient dropped down to 40 mg because of difficulty with diaphoresis.  She also stopped her aspirin.  She states that she is having less discomfort but still having some diaphoresis.    Review of Systems     Objective:   Physical Exam Alert and in no distress otherwise not examined.        Assessment & Plan:  PMR (polymyalgia rheumatica) (HCC)  She seems to be fairly stable on the 40 mg of prednisone.  I explained to her that the workup she got was appropriate and that if she was uncomfortable with Dr. Lenna Gilford, we would attempt to get her in with a different physician.  Also recommend that if she was not happy with that, to send a letter to the practice.

## 2017-04-05 NOTE — Telephone Encounter (Signed)
Pt was called to let her know to expect a call from Dr. Lenna Gilford office from their office manager Nira Conn. Pt says that she has heard from them already. Thanks [employer]

## 2017-04-15 ENCOUNTER — Other Ambulatory Visit: Payer: Self-pay | Admitting: Family Medicine

## 2017-04-17 NOTE — Telephone Encounter (Signed)
Is this okay to refill? 

## 2017-05-22 ENCOUNTER — Encounter: Payer: Self-pay | Admitting: Family Medicine

## 2017-05-22 ENCOUNTER — Telehealth: Payer: Self-pay

## 2017-05-22 ENCOUNTER — Ambulatory Visit (INDEPENDENT_AMBULATORY_CARE_PROVIDER_SITE_OTHER): Payer: PPO | Admitting: Family Medicine

## 2017-05-22 VITALS — BP 154/96 | HR 92 | Wt 220.0 lb

## 2017-05-22 DIAGNOSIS — F418 Other specified anxiety disorders: Secondary | ICD-10-CM

## 2017-05-22 DIAGNOSIS — M353 Polymyalgia rheumatica: Secondary | ICD-10-CM

## 2017-05-22 DIAGNOSIS — G3184 Mild cognitive impairment, so stated: Secondary | ICD-10-CM | POA: Diagnosis not present

## 2017-05-22 MED ORDER — ALPRAZOLAM 0.25 MG PO TABS: 0.2500 mg | ORAL_TABLET | Freq: Two times a day (BID) | ORAL | 0 refills | Status: DC | PRN

## 2017-05-22 MED ORDER — MEMANTINE HCL 10 MG PO TABS
ORAL_TABLET | ORAL | 1 refills | Status: AC
Start: 2017-05-22 — End: ?

## 2017-05-22 NOTE — Progress Notes (Signed)
   Subjective:    Patient ID: Stacey Wells, female    DOB: [birth year], 77 y.o.   MRN: [id number]  HPI She is here for multiple issues.  She and her husband are getting ready to move to [place].  They sold her house in 1 day, then drove to [place] and bought a condo very quickly.  They are now in the process of packing up everything and moving by the end of April.  This has her quite anxious.  She presently is on Celexa but would like something else to help.  She also needs a refill on her Namenda.  She does also complain of weight gain and swelling and has been on 60 of prednisone for treatment of PMR.   Review of Systems     Objective:   Physical Exam Alert and in no distress with appropriate affect and dressed appropriately otherwise not examined       Assessment & Plan:  Situational anxiety - Plan: ALPRAZolam (XANAX) 0.25 MG tablet  PMR (polymyalgia rheumatica) (HCC)  MCI (mild cognitive impairment) with memory loss - Plan: memantine (NAMENDA) 10 MG tablet I explained that being anxious under the circumstances not unusual.  She has had a lot of things happen very very quickly.  I will give her Xanax to help knock the edge off of this.  She is also to cut her prednisone dosing down to 40 mg daily and call in 1 week to let me know how she is doing.  I would try to taper her down to 10-15 mg depending upon her symptoms.

## 2017-05-22 NOTE — Telephone Encounter (Signed)
Pt was called to let her know that per Dr. Redmond School she is to only take two tablets a day of prednidsone. Pt can take them together or one in the am and one in the pm. Thanks [facility]

## 2017-05-29 ENCOUNTER — Other Ambulatory Visit: Payer: Self-pay | Admitting: Family Medicine

## 2017-05-29 DIAGNOSIS — I10 Essential (primary) hypertension: Secondary | ICD-10-CM

## 2017-06-12 ENCOUNTER — Ambulatory Visit (INDEPENDENT_AMBULATORY_CARE_PROVIDER_SITE_OTHER): Payer: PPO | Admitting: Family Medicine

## 2017-06-12 VITALS — BP 142/88 | HR 90 | Temp 98.0°F | Ht 65.0 in | Wt 220.8 lb

## 2017-06-12 DIAGNOSIS — F418 Other specified anxiety disorders: Secondary | ICD-10-CM

## 2017-06-12 DIAGNOSIS — M353 Polymyalgia rheumatica: Secondary | ICD-10-CM | POA: Diagnosis not present

## 2017-06-12 MED ORDER — ALPRAZOLAM 0.25 MG PO TABS: 0.2500 mg | ORAL_TABLET | Freq: Two times a day (BID) | ORAL | 0 refills | Status: AC | PRN

## 2017-06-12 MED ORDER — PREDNISONE 10 MG PO TABS: ORAL_TABLET | ORAL | 0 refills | Status: AC

## 2017-06-12 NOTE — Progress Notes (Signed)
   Subjective:    Patient ID: Stacey Wells, female    DOB: 18-Dec-1940, 77 y.o.   MRN: [id number]  HPI She is here for recheck.  She recently dropped down to 20 mg of prednisone.  She has had some side effects of prednisone making her slightly jittery which is adding to her anxiety.  She has been using Xanax but it makes her drowsy during the day but does help with sleep at night.   Review of Systems     Objective:   Physical Exam Alert and in no distress dressed appropriately with appropriate affect she will continue on the 20 mg for another week and then go down to 10 mg.  Otherwise not examined       Assessment & Plan:  PMR (polymyalgia rheumatica) (HCC) - Plan: predniSONE (DELTASONE) 10 MG tablet  Situational anxiety - Plan: ALPRAZolam (XANAX) 0.25 MG tablet     She will continue on 20 mg for another week and then go down to 10 mg.  Recommend she get follow-up with a physician in her new town.  Also recommend she use a half a Xanax during the day if she is having difficulty with anxiety and then use it at night.  Explained that as we decrease the prednisone hopefully some of her anxiety we will also diminished also she will be hopefully less stressed and she will be in her new environment.

## 2017-06-12 NOTE — Patient Instructions (Addendum)
Take 20 mg for the rest of the week and then go down to the 10 mg dosing

## 2017-06-27 ENCOUNTER — Telehealth: Payer: Self-pay | Admitting: Family Medicine

## 2017-06-27 NOTE — Telephone Encounter (Signed)
How many ? 

## 2017-06-27 NOTE — Telephone Encounter (Signed)
Done [place] 06-27-17

## 2017-06-27 NOTE — Telephone Encounter (Signed)
ok 

## 2017-06-27 NOTE — Telephone Encounter (Signed)
Pharmacy called and is requesting a refill on pts  Xanax states until pt finds new dr, [facility] [address] [place] [address] [phone]

## 2017-06-27 NOTE — Telephone Encounter (Signed)
30

## 2017-07-20 ENCOUNTER — Telehealth: Payer: Self-pay | Admitting: Neurology

## 2017-07-20 ENCOUNTER — Ambulatory Visit: Payer: PPO | Admitting: Neurology

## 2017-07-20 NOTE — Telephone Encounter (Signed)
Pt was a no show for apt today

## 2017-07-21 ENCOUNTER — Encounter: Payer: Self-pay | Admitting: Neurology

## 2017-08-28 ENCOUNTER — Other Ambulatory Visit: Payer: Self-pay | Admitting: Family Medicine

## 2017-08-28 DIAGNOSIS — I10 Essential (primary) hypertension: Secondary | ICD-10-CM

## 2018-06-18 ENCOUNTER — Other Ambulatory Visit: Payer: Self-pay | Admitting: Family Medicine

## 2018-06-18 DIAGNOSIS — G609 Hereditary and idiopathic neuropathy, unspecified: Secondary | ICD-10-CM

## 2018-06-18 NOTE — Telephone Encounter (Signed)
[facility] is requesting to fill pt amitriotyline. Please advise [facility]
